# Patient Record
Sex: Female | Born: 1970 | Race: Black or African American | Hispanic: No | Smoking: Never smoker
Health system: Southern US, Community
[De-identification: ages and names within clinical notes are randomized; demographics above are authoritative.]

## PROBLEM LIST (undated history)

## (undated) DIAGNOSIS — D649 Anemia, unspecified: Secondary | ICD-10-CM

## (undated) DIAGNOSIS — I10 Essential (primary) hypertension: Secondary | ICD-10-CM

## (undated) DIAGNOSIS — R519 Headache, unspecified: Secondary | ICD-10-CM

## (undated) DIAGNOSIS — H6690 Otitis media, unspecified, unspecified ear: Secondary | ICD-10-CM

## (undated) DIAGNOSIS — Z8639 Personal history of other endocrine, nutritional and metabolic disease: Secondary | ICD-10-CM

## (undated) HISTORY — PX: ABDOMINAL HYSTERECTOMY: SHX81

---

## 2014-03-01 ENCOUNTER — Emergency Department (HOSPITAL_COMMUNITY)
Admission: EM | Admit: 2014-03-01 | Discharge: 2014-03-01 | Disposition: A | Discharge status: Home / Self Care | Payer: BC Managed Care – PPO | Attending: Emergency Medicine | Admitting: Emergency Medicine

## 2014-03-01 ENCOUNTER — Emergency Department (HOSPITAL_COMMUNITY): Payer: BC Managed Care – PPO

## 2014-03-01 ENCOUNTER — Encounter (HOSPITAL_COMMUNITY): Payer: Self-pay | Admitting: Emergency Medicine

## 2014-03-01 DIAGNOSIS — R0789 Other chest pain: Secondary | ICD-10-CM | POA: Diagnosis not present

## 2014-03-01 DIAGNOSIS — I10 Essential (primary) hypertension: Secondary | ICD-10-CM | POA: Diagnosis not present

## 2014-03-01 DIAGNOSIS — Z7982 Long term (current) use of aspirin: Secondary | ICD-10-CM | POA: Diagnosis not present

## 2014-03-01 DIAGNOSIS — M545 Low back pain, unspecified: Secondary | ICD-10-CM | POA: Diagnosis not present

## 2014-03-01 DIAGNOSIS — D649 Anemia, unspecified: Secondary | ICD-10-CM | POA: Diagnosis not present

## 2014-03-01 DIAGNOSIS — R5381 Other malaise: Secondary | ICD-10-CM | POA: Diagnosis not present

## 2014-03-01 DIAGNOSIS — Z79899 Other long term (current) drug therapy: Secondary | ICD-10-CM | POA: Insufficient documentation

## 2014-03-01 DIAGNOSIS — R5383 Other fatigue: Secondary | ICD-10-CM | POA: Diagnosis not present

## 2014-03-01 DIAGNOSIS — R0602 Shortness of breath: Secondary | ICD-10-CM | POA: Insufficient documentation

## 2014-03-01 DIAGNOSIS — R079 Chest pain, unspecified: Secondary | ICD-10-CM | POA: Diagnosis present

## 2014-03-01 HISTORY — DX: Essential (primary) hypertension: I10

## 2014-03-01 HISTORY — DX: Anemia, unspecified: D64.9

## 2014-03-01 LAB — CBC
HEMATOCRIT: 31.7 % — AB (ref 36.0–46.0)
HEMOGLOBIN: 10.7 g/dL — AB (ref 12.0–15.0)
MCH: 30.9 pg (ref 26.0–34.0)
MCHC: 33.8 g/dL (ref 30.0–36.0)
MCV: 91.6 fL (ref 78.0–100.0)
Platelets: 284 10*3/uL (ref 150–400)
RBC: 3.46 MIL/uL — ABNORMAL LOW (ref 3.87–5.11)
RDW: 14.4 % (ref 11.5–15.5)
WBC: 3.5 10*3/uL — AB (ref 4.0–10.5)

## 2014-03-01 LAB — BASIC METABOLIC PANEL
Anion gap: 11 (ref 5–15)
BUN: 10 mg/dL (ref 6–23)
CHLORIDE: 107 meq/L (ref 96–112)
CO2: 24 mEq/L (ref 19–32)
Calcium: 8.4 mg/dL (ref 8.4–10.5)
Creatinine, Ser: 0.89 mg/dL (ref 0.50–1.10)
GFR calc Af Amer: >90 mL/min (ref >90)
GFR calc non Af Amer: 78 mL/min — ABNORMAL LOW (ref >90)
GLUCOSE: 75 mg/dL (ref 70–99)
POTASSIUM: 3.5 meq/L — AB (ref 3.7–5.3)
Sodium: 142 mEq/L (ref 137–147)

## 2014-03-01 LAB — D-DIMER, QUANTITATIVE: D-Dimer, Quant: 5.84 ug/mL-FEU — ABNORMAL HIGH (ref 0.00–0.48)

## 2014-03-01 LAB — PRO B NATRIURETIC PEPTIDE: Pro B Natriuretic peptide (BNP): 94 pg/mL (ref 0–125)

## 2014-03-01 LAB — I-STAT TROPONIN, ED: TROPONIN I, POC: 0.02 ng/mL (ref 0.00–0.08)

## 2014-03-01 MED ORDER — PREDNISONE 20 MG PO TABS: ORAL_TABLET | ORAL | Status: DC

## 2014-03-01 MED ORDER — ALBUTEROL SULFATE HFA 108 (90 BASE) MCG/ACT IN AERS
2.0000 | INHALATION_SPRAY | RESPIRATORY_TRACT | Status: DC
Start: 2014-03-01 — End: 2014-03-01
  Administered 2014-03-01: 2 via RESPIRATORY_TRACT
  Filled 2014-03-01: qty 6.7

## 2014-03-01 MED ORDER — PREDNISONE 20 MG PO TABS
60.0000 mg | ORAL_TABLET | Freq: Once | ORAL | Status: AC
  Administered 2014-03-01: 60 mg via ORAL
  Filled 2014-03-01: qty 3

## 2014-03-01 MED ORDER — IOHEXOL 350 MG/ML SOLN
80.0000 mL | Freq: Once | INTRAVENOUS | Status: AC | PRN
  Administered 2014-03-01: 80 mL via INTRAVENOUS

## 2014-03-01 MED ORDER — KETOROLAC TROMETHAMINE 30 MG/ML IJ SOLN
30.0000 mg | Freq: Once | INTRAMUSCULAR | Status: AC
  Administered 2014-03-01: 30 mg via INTRAVENOUS
  Filled 2014-03-01: qty 1

## 2014-03-01 MED ORDER — SODIUM CHLORIDE 0.9 % IV BOLUS (SEPSIS)
1000.0000 mL | Freq: Once | INTRAVENOUS | Status: AC
  Administered 2014-03-01: 1000 mL via INTRAVENOUS

## 2014-03-01 MED ORDER — ALBUTEROL SULFATE (2.5 MG/3ML) 0.083% IN NEBU
5.0000 mg | INHALATION_SOLUTION | Freq: Once | RESPIRATORY_TRACT | Status: AC
  Administered 2014-03-01: 5 mg via RESPIRATORY_TRACT
  Filled 2014-03-01: qty 6

## 2014-03-01 NOTE — ED Provider Notes (Signed)
CSN: [id number]     Arrival date & time 03/01/14  1122 History   First MD Initiated Contact with Patient 03/01/14 1247     Chief Complaint  Patient presents with  . Chest Pain  . Shortness of Breath     (Consider location/radiation/quality/duration/timing/severity/associated sxs/prior Treatment) HPI Comments: Melissa Yang is a 43 y.o. Female with a PMHx of HTN and anemia, who presents to the ED today with ongoing chest tightness and SOB/DOE x2 weeks. States she was seen at [facility] 2 weeks ago, admitted at [facility] for "elevated enzymes", had an echo and heart cath which were negative but they told her she had "heart muscle inflammation". States her pain is continuing despite using the ibuprofen and prilosec they gave her at discharge 1wk ago. Pt states her CP is 6/10, pressure like, constant, located in her midsternal area, nonradiating, worse with inspiration and palpation, and alleviated some with aleve. States she's been SOB since discharge, mostly with exertion, stating she walks up 6 steps and gets winded. Endorses a nonproductive cough at night when lying flat, and uses 3 pillows to sleep, but this has not increased in the recent weeks. Also endorses ongoing low back pain worse with lifting or bending, but denies that this is coming from her chest. Denies fevers, chills, diaphoresis, jaw pain, upper back pain, abd pain, n/v/d/c, hematuria, dysuria, melena, hematochezia, HA, syncope, dizziness, LE swelling, recent travel, estrogen use (s/p hysterectomy due to heavy periods and anemia). States she takes ferritin nightly. Denies hx of DVT/PE. States she doesn't think they did any labs or imaging for PE/DVT at her last admission one week ago.  Patient is a 43 y.o. female presenting with shortness of breath. The history is provided by the patient. No language interpreter was used.  Shortness of Breath Severity:  Mild Onset quality:  Gradual Duration:  2 weeks Timing:   Constant Progression:  Unchanged Chronicity:  Chronic Context: activity   Relieved by:  Rest Worsened by:  Activity and deep breathing Ineffective treatments:  None tried Associated symptoms: chest pain and cough (when laying down, nonproductive)   Associated symptoms: no abdominal pain, no claudication, no diaphoresis, no fever, no headaches, no hemoptysis, no neck pain, no PND, no rash, no sputum production, no syncope, no vomiting and no wheezing   Chest pain:    Quality:  Pressure   Severity:  Moderate   Onset quality:  Gradual   Duration:  2 weeks   Timing:  Constant   Progression:  Unchanged   Chronicity:  Chronic Risk factors: recent surgery (recent heart cath)   Risk factors: no family hx of DVT, no hx of cancer, no hx of PE/DVT and no oral contraceptive use     Past Medical History  Diagnosis Date  . Hypertension   . Anemia    Past Surgical History  Procedure Laterality Date  . Abdominal hysterectomy     History reviewed. No pertinent family history. History  Substance Use Topics  . Smoking status: Not on file  . Smokeless tobacco: Not on file  . Alcohol Use: No   OB History   Grav Para Term Preterm Abortions TAB SAB Ect Mult Living                 Review of Systems  Constitutional: Positive for fatigue. Negative for fever, chills and diaphoresis.  HENT: Negative for nosebleeds.   Respiratory: Positive for cough (when laying down, nonproductive) and shortness of breath. Negative for hemoptysis, sputum production  and wheezing.   Cardiovascular: Positive for chest pain. Negative for palpitations, claudication, leg swelling, syncope and PND.  Gastrointestinal: Negative for nausea, vomiting, abdominal pain, diarrhea, constipation, blood in stool and abdominal distention.  Genitourinary: Negative for dysuria, urgency, frequency, hematuria, decreased urine volume, vaginal bleeding, vaginal discharge and pelvic pain.  Musculoskeletal: Positive for back pain (low  back). Negative for arthralgias, joint swelling, myalgias and neck pain.  Skin: Negative for rash.  Neurological: Negative for dizziness, syncope, weakness, numbness and headaches.  Hematological: Does not bruise/bleed easily.  Psychiatric/Behavioral: Negative for confusion.  10 Systems reviewed and are negative for acute change except as noted in the HPI.     Allergies  Review of patient's allergies indicates no known allergies.  Home Medications   Prior to Admission medications   Medication Sig Start Date End Date Taking? Authorizing Provider  ALPRAZolam Prudy Feeler) 1 MG tablet Take 1 mg by mouth daily as needed for anxiety.   Yes Historical Provider, MD  amLODipine-benazepril (LOTREL) 5-40 MG per capsule Take 1 capsule by mouth 2 (two) times daily.   Yes Historical Provider, MD  aspirin 81 MG tablet Take 81 mg by mouth daily.   Yes Historical Provider, MD  ibuprofen (ADVIL,MOTRIN) 600 MG tablet Take 600 mg by mouth every 6 (six) hours as needed for moderate pain.   Yes Historical Provider, MD  predniSONE (DELTASONE) 20 MG tablet 3 tabs po daily x 4 days 03/01/14   [place] Camprubi-Soms, PA-C   BP 111/86  Pulse 93  Temp(Src) 97.8 F (36.6 C) (Oral)  Resp 21  Ht  (1.676 m)  Wt 172 lb 9 oz (78.274 kg)  BMI 27.87 kg/m2  SpO2 100% Physical Exam  Nursing note and vitals reviewed. Constitutional: She is oriented to person, place, and time. Vital signs are normal. She appears well-developed and well-nourished. No distress.  VSS, NAD  HENT:  Head: Normocephalic and atraumatic.  Mouth/Throat: Mucous membranes are dry.  Mildly dry mucous membranes  Eyes: Conjunctivae and EOM are normal. Right eye exhibits no discharge. Left eye exhibits no discharge.  Neck: Normal range of motion. Neck supple. No JVD present.  Cardiovascular: Normal rate, regular rhythm, normal heart sounds and intact distal pulses.  Exam reveals no gallop and no friction rub.   No murmur heard. RRR, nl  s1/s2, no m/r/g, distal pulses intact  Pulmonary/Chest: Effort normal and breath sounds normal. No respiratory distress. She has no decreased breath sounds. She has no wheezes. She has no rhonchi. She has no rales. She exhibits tenderness. She exhibits no crepitus, no edema and no deformity.    CTAB in all lung fields, no increased WOB, oxygenating >95% on RA, no w/r/r. Reproducible CP with TTP over sternum, no crepitus or deformity, no subQ air  Abdominal: Soft. Normal appearance and bowel sounds are normal. She exhibits no distension. There is no tenderness. There is no rigidity, no rebound and no guarding.  Musculoskeletal: Normal range of motion.  MAE x4, strength and sensation grossly intact Neg homan's sign bilaterally No pedal edema  Neurological: She is alert and oriented to person, place, and time. She has normal strength. No sensory deficit.  Skin: Skin is warm, dry and intact. No rash noted.  Psychiatric: She has a normal mood and affect.    ED Course  Procedures (including critical care time) Orthostatic VS 1405: Orthostatic Lying - BP- Lying: 118/80 mmHg ; Pulse- Lying: 90  Orthostatic Sitting - BP- Sitting: 124/93 mmHg ; Pulse- Sitting: 90  Orthostatic Standing at 0 minutes - BP- Standing at 0 minutes: 112/81 mmHg ; Pulse- Standing at 0 minutes: 103   Labs Review Labs Reviewed  CBC - Abnormal; Notable for the following:    WBC 3.5 (*)    RBC 3.46 (*)    Hemoglobin 10.7 (*)    HCT 31.7 (*)    All other components within normal limits  BASIC METABOLIC PANEL - Abnormal; Notable for the following:    Potassium 3.5 (*)    GFR calc non Af Amer 78 (*)    All other components within normal limits  D-DIMER, QUANTITATIVE - Abnormal; Notable for the following:    D-Dimer, Quant 5.84 (*)    All other components within normal limits  PRO B NATRIURETIC PEPTIDE  I-STAT TROPOININ, ED    Imaging Review Dg Chest 2 View  03/01/2014   CLINICAL DATA:  Chest pain and shortness of  breath.  EXAM: CHEST - 2 VIEW  COMPARISON:  None  FINDINGS: The heart size and mediastinal contours are within normal limits. There is no evidence of pulmonary edema, consolidation, pneumothorax, nodule or pleural fluid. The visualized skeletal structures are unremarkable.  IMPRESSION: No active disease.   Electronically Signed   By: Irish Lack M.D.   On: 03/01/2014 12:44   Ct Angio Chest Pe W/cm &/or Wo Cm  03/01/2014   CLINICAL DATA:  Inspiratory chest pain with elevated D-dimer. Chest pain a shortness-of-breath 2 weeks. Nonproductive cough. Negative heart catheterization.  EXAM: CT ANGIOGRAPHY CHEST WITH CONTRAST  TECHNIQUE: Multidetector CT imaging of the chest was performed using the standard protocol during bolus administration of intravenous contrast. Multiplanar CT image reconstructions and MIPs were obtained to evaluate the vascular anatomy.  CONTRAST:  80mL OMNIPAQUE IOHEXOL 350 MG/ML SOLN  COMPARISON:  Chest x-ray today.  FINDINGS: The lungs are adequately inflated without consolidation or effusion. Airways are within normal. The heart is normal size. Pulmonary arterial system is within normal without evidence of emboli. The thoracic aorta is within normal. Remaining mediastinal structures are normal.  Images through the upper abdomen are within normal. Remainder the exam is unremarkable.  Review of the MIP images confirms the above findings.  IMPRESSION: No acute cardiopulmonary disease. No evidence of pulmonary embolism.   Electronically Signed   By: Elberta Fortis M.D.   On: 03/01/2014 16:13   From outside hospital: CTA angio 02/19/2014 FINDINGS:  NO evidence of PE, aneurysm/dissection, or other vascular abnormality.  NO acute abnormality in the mediastinum or axillae (no mass, hematoma, or adenopathy).  The 4.7 mm groundglass nodule of the lingula seen on CT dated February 06, 2012 is without change and can therefore be considered benign, requiring no further followup. NO lung consolidation,  pleural effusion, pneumothorax, or other acute abnormality.  NO acute osseous lesion. IMPRESSION: No acute findings  Diagnostics: echo 2d 02/20/2014 portable limited echocardiogram was performed. A two-dimensional transthoracic echocardiogram, with color flow Doppler was performed. Limited views were obtained. There is no comparison study available. The left ventricle is normal in size, wall thickness and wall motion with ejection fraction of 55-60%. The aortic valve is not well visualized, but is grossly normal.  Cardiac cath 02/21/2014 Impression: Near-normal coronary arteries Left ventricular end-diastolic pressure 15 mm Hg      EKG Interpretation None    EKG 03/01/14: sinus tachy HR 101, otherwise normal  MDM   Final diagnoses:  Atypical chest pain  SOB (shortness of breath)  Anemia, unspecified anemia type    43y/o  female with ongoing CP/SOB x2 wks, seen previously at [facility] and transferred to [facility]. Had echo and heart cath without any abnormal results. At d/c they told her to use ibuprofen and nexium for ?musculoskeletal pain (or heart muscle irritation? Pt unclear, initially unable to obtain OSH documents). Pt denies that they did anything to r/o clots. Labs obtained in triage reveal neg trop, CBC showing H/H at 10.7/31.7. Pt is on iron for chronic anemia, will attempt to get OSH results for comparison, but this could be source of DOE/SOB. Orthostatics positive (DBP with drop from sitting to standing), therefore will start fluids. BMP showing K 3.5, does not need repletion. BNP 94. CXR WNL. EKG showing sinus tachy at 101, but pt not tachycardic upon exam. Oxygenating well on RA. Given inspiratory CP/SOB, will obtain dimer. Will give toradol for pain, given that it's reproducible and could be musculoskeletal. Will reassess after this.   2:50 PM Ddimer elevated to 5.84. Will order CT chest for PE.   4:34 PM CTA chest neg for PE or other intrathoracic  etiology. BP improved after 1L.  Will attempt to ambulate pt in halls and monitor oxygenation, if pt unable to do this, will admit for further work up. OSH records have now been obtained, and it appears her anemia is stable from prior results, and that dimer on 8/28 was also elevated, and CTA also revealed no PE/dissection. It appears they diagnosed her with "myopericarditis".   5:30 PM Pt ambulated with Dr. Patria Mane, able to maintain oxygen saturations. Pt states she has used albuterol in the past for SOB, although this wasn't in her meds list and no mention of asthma on hx, as well as clear lung exam. Dr. Patria Mane ordered  prednisone and albuterol inhaler for pt to go home with. Plan to d/c with prednisone for 4 days. Discussed ongoing use of ibuprofen as directed by her discharge instructions previously. Pt to f/up with pulmonology this week for PFTs and ongoing management.   BP 135/93  Pulse 107  Temp(Src) 97.8 F (36.6 C) (Oral)  Resp 18  Ht  (1.676 m)  Wt 172 lb 9 oz (78.274 kg)  BMI 27.87 kg/m2  SpO2 100%  Meds ordered this encounter  Medications  . sodium chloride 0.9 % bolus 1,000 mL    Sig:   . ketorolac (TORADOL) 30 MG/ML injection 30 mg    Sig:   . iohexol (OMNIPAQUE) 350 MG/ML injection 80 mL    Sig:   . albuterol (PROVENTIL HFA;VENTOLIN HFA) 108 (90 BASE) MCG/ACT inhaler 2 puff    Sig:   . predniSONE (DELTASONE) tablet 60 mg    Sig:   . albuterol (PROVENTIL) (2.5 MG/3ML) 0.083% nebulizer solution 5 mg    Sig:   . predniSONE (DELTASONE) 20 MG tablet    Sig: 3 tabs po daily x 4 days    Dispense:  12 tablet    Refill:  0    Order Specific Question:  Supervising Provider    Answer:  Vida Roller [address] Camprubi-Soms, PA-C 03/01/14 1942

## 2014-03-01 NOTE — ED Provider Notes (Signed)
Medical screening examination/treatment/procedure(s) were conducted as a shared visit with non-physician practitioner(s) and myself.  I personally evaluated the patient during the encounter.   EKG Interpretation   Date/Time:  Sunday March 01 2014 11:58:55 EDT Ventricular Rate:  101 PR Interval:  156 QRS Duration: 84 QT Interval:  340 QTC Calculation: 440 R Axis:   43 Text Interpretation:  Sinus tachycardia Otherwise normal ECG No  significant change was found Confirmed by Zyiere Rosemond  MD, Zyron Deeley ([id number]) on  03/01/2014 9:50:26 PM      CTA normal. Recent heart cath at [facility] normal. i personally ambulated with the pt in the ER. Returned to her room with normal 02 saturations. No increased work of breathing. Will trial on steroids and albuterol with outpatient pulmonary follow up. Echo during recent [facility] hospitalization with EF of 65%. Pt understands to return to ER for new or worsening symptoms  Dg Chest 2 View  03/01/2014   CLINICAL DATA:  Chest pain and shortness of breath.  EXAM: CHEST - 2 VIEW  COMPARISON:  None  FINDINGS: The heart size and mediastinal contours are within normal limits. There is no evidence of pulmonary edema, consolidation, pneumothorax, nodule or pleural fluid. The visualized skeletal structures are unremarkable.  IMPRESSION: No active disease.   Electronically Signed   By: Irish Lack M.D.   On: 03/01/2014 12:44   Ct Angio Chest Pe W/cm &/or Wo Cm  03/01/2014   CLINICAL DATA:  Inspiratory chest pain with elevated D-dimer. Chest pain a shortness-of-breath 2 weeks. Nonproductive cough. Negative heart catheterization.  EXAM: CT ANGIOGRAPHY CHEST WITH CONTRAST  TECHNIQUE: Multidetector CT imaging of the chest was performed using the standard protocol during bolus administration of intravenous contrast. Multiplanar CT image reconstructions and MIPs were obtained to evaluate the vascular anatomy.  CONTRAST:  80mL OMNIPAQUE IOHEXOL 350 MG/ML SOLN  COMPARISON:  Chest x-ray  today.  FINDINGS: The lungs are adequately inflated without consolidation or effusion. Airways are within normal. The heart is normal size. Pulmonary arterial system is within normal without evidence of emboli. The thoracic aorta is within normal. Remaining mediastinal structures are normal.  Images through the upper abdomen are within normal. Remainder the exam is unremarkable.  Review of the MIP images confirms the above findings.  IMPRESSION: No acute cardiopulmonary disease. No evidence of pulmonary embolism.   Electronically Signed   By: Elberta Fortis M.D.   On: 03/01/2014 16:13  I personally reviewed the imaging tests through PACS system I reviewed available ER/hospitalization records through the EMR   Lyanne Co, MD 03/01/14 2154

## 2014-03-01 NOTE — Discharge Instructions (Signed)
Your chest pain was evaluated here today and does not look like it's related to your heart or lungs. It could be muscle pain, continue using ibuprofen as directed by your discharge instructions after your last hospitalization. Use the inhaler given to you today as directed and as needed for shortness of breath. Use prednisone as directed to help with your breathing and inflammation. See [facility] pulmonology this week for pulmonary function studies to evaluate your ongoing shortness of breath. Continue taking your ferritin as directed, with orange juice. Return to the ER for changes or worsening symptoms.   Chest Pain (Nonspecific) It is often hard to give a diagnosis for the cause of chest pain. There is always a chance that your pain could be related to something serious, such as a heart attack or a blood clot in the lungs. You need to follow up with your doctor. HOME CARE  If antibiotic medicine was given, take it as directed by your doctor. Finish the medicine even if you start to feel better.  For the next few days, avoid activities that bring on chest pain. Continue physical activities as told by your doctor.  Do not use any tobacco products. This includes cigarettes, chewing tobacco, and e-cigarettes.  Avoid drinking alcohol.  Only take medicine as told by your doctor.  Follow your doctor's suggestions for more testing if your chest pain does not go away.  Keep all doctor visits you made. GET HELP IF:  Your chest pain does not go away, even after treatment.  You have a rash with blisters on your chest.  You have a fever. GET HELP RIGHT AWAY IF:   You have more pain or pain that spreads to your arm, neck, jaw, back, or belly (abdomen).  You have shortness of breath.  You cough more than usual or cough up blood.  You have very bad back or belly pain.  You feel sick to your stomach (nauseous) or throw up (vomit).  You have very bad weakness.  You pass out (faint).  You  have chills. This is an emergency. Do not wait to see if the problems will go away. Call your local emergency services (911 in [place] Do not drive yourself to the hospital. MAKE SURE YOU:   Understand these instructions.  Will watch your condition.  Will get help right away if you are not doing well or get worse. Document Released: 11/29/2007 Document Revised: 06/17/2013 Document Reviewed: 11/29/2007 [facility] Patient Information 2015 [place], [place]. This information is not intended to replace advice given to you by your health care provider. Make sure you discuss any questions you have with your health care provider.  Anemia, Nonspecific Anemia is a condition in which the concentration of red blood cells or hemoglobin in the blood is below normal. Hemoglobin is a substance in red blood cells that carries oxygen to the tissues of the body. Anemia results in not enough oxygen reaching these tissues.  CAUSES  Common causes of anemia include:   Excessive bleeding. Bleeding may be internal or external. This includes excessive bleeding from periods (in women) or from the intestine.   Poor nutrition.   Chronic kidney, thyroid, and liver disease.  Bone marrow disorders that decrease red blood cell production.  Cancer and treatments for cancer.  HIV, AIDS, and their treatments.  Spleen problems that increase red blood cell destruction.  Blood disorders.  Excess destruction of red blood cells due to infection, medicines, and autoimmune disorders. SIGNS AND SYMPTOMS   Minor  weakness.   Dizziness.   Headache.  Palpitations.   Shortness of breath, especially with exercise.   Paleness.  Cold sensitivity.  Indigestion.  Nausea.  Difficulty sleeping.  Difficulty concentrating. Symptoms may occur suddenly or they may develop slowly.  DIAGNOSIS  Additional blood tests are often needed. These help your health care provider determine the best treatment. Your health care  provider will check your stool for blood and look for other causes of blood loss.  TREATMENT  Treatment varies depending on the cause of the anemia. Treatment can include:   Supplements of iron, vitamin B12, or folic acid.   Hormone medicines.   A blood transfusion. This may be needed if blood loss is severe.   Hospitalization. This may be needed if there is significant continual blood loss.   Dietary changes.  Spleen removal. HOME CARE INSTRUCTIONS Keep all follow-up appointments. It often takes many weeks to correct anemia, and having your health care provider check on your condition and your response to treatment is very important. SEEK IMMEDIATE MEDICAL CARE IF:   You develop extreme weakness, shortness of breath, or chest pain.   You become dizzy or have trouble concentrating.  You develop heavy vaginal bleeding.   You develop a rash.   You have bloody or black, tarry stools.   You faint.   You vomit up blood.   You vomit repeatedly.   You have abdominal pain.  You have a fever or persistent symptoms for more than 2-3 days.   You have a fever and your symptoms suddenly get worse.   You are dehydrated.  MAKE SURE YOU:  Understand these instructions.  Will watch your condition.  Will get help right away if you are not doing well or get worse. Document Released: 07/20/2004 Document Revised: 02/12/2013 Document Reviewed: 12/06/2012 [facility] Patient Information 2015 [place], [place]. This information is not intended to replace advice given to you by your health care provider. Make sure you discuss any questions you have with your health care provider.  Shortness of Breath Shortness of breath means you have trouble breathing. Shortness of breath needs medical care right away. HOME CARE   Do not smoke.  Avoid being around chemicals or things (paint fumes, dust) that may bother your breathing.  Rest as needed. Slowly begin your normal  activities.  Only take medicines as told by your doctor.  Keep all doctor visits as told. GET HELP RIGHT AWAY IF:   Your shortness of breath gets worse.  You feel lightheaded, pass out (faint), or have a cough that is not helped by medicine.  You cough up blood.  You have pain with breathing.  You have pain in your chest, arms, shoulders, or belly (abdomen).  You have a fever.  You cannot walk up stairs or exercise the way you normally do.  You do not get better in the time expected.  You have a hard time doing normal activities even with rest.  You have problems with your medicines.  You have any new symptoms. MAKE SURE YOU:  Understand these instructions.  Will watch your condition.  Will get help right away if you are not doing well or get worse. Document Released: 11/29/2007 Document Revised: 06/17/2013 Document Reviewed: 08/28/2011 [facility] Patient Information 2015 [place], [place]. This information is not intended to replace advice given to you by your health care provider. Make sure you discuss any questions you have with your health care provider.

## 2014-03-01 NOTE — ED Notes (Addendum)
Pt reports having chest pressure and sob x 2 weeks. Has non productive cough, mid back pain and sob with exertion. Has been to [facility] and reports having +enzymes and negative heart cath. Did not have ct scan to r/o PE. Airway intact at triage and EKG done.

## 2014-03-05 ENCOUNTER — Encounter: Payer: Self-pay | Admitting: Internal Medicine

## 2014-03-05 ENCOUNTER — Ambulatory Visit (INDEPENDENT_AMBULATORY_CARE_PROVIDER_SITE_OTHER): Payer: BC Managed Care – PPO | Admitting: Internal Medicine

## 2014-03-05 VITALS — BP 120/72 | HR 104 | Temp 98.4°F | Ht 66.0 in | Wt 176.6 lb

## 2014-03-05 DIAGNOSIS — I10 Essential (primary) hypertension: Secondary | ICD-10-CM | POA: Insufficient documentation

## 2014-03-05 DIAGNOSIS — R079 Chest pain, unspecified: Secondary | ICD-10-CM

## 2014-03-05 MED ORDER — AMLODIPINE-OLMESARTAN 5-20 MG PO TABS: ORAL_TABLET | ORAL | Status: DC

## 2014-03-05 MED ORDER — TRAMADOL HCL 50 MG PO TABS
ORAL_TABLET | ORAL | Status: DC
Start: 2014-03-05 — End: 2018-05-17

## 2014-03-05 NOTE — Progress Notes (Signed)
Subjective:    Patient ID: Melissa Yang, female    DOB: 03/23/1971  MRN: [id number]  HPI  61 yobf never smoker never sign problem other than HBP on ACEi then winter 2015 sinus drainage, severe cough >  [facility] dx as sinus infection/ "bronchitis"  and rx abx and inhaler which seems to work s needing maint rx  then onset of chest pressure x late August 2015 develped cough in setting of ? Recurrent sinus infection then midline chest discomt >  took antacids no better then evolved to chest pain/back pain and sob > [place] > forsynth > neg echo/ [facility] rx as pericarditis with nsaids and prednisone no better then > [facility] ER  and neg CTa (second one) and referred  To  pulmonary  03/05/2014    03/05/2014 1st [facility] office visit/ Melissa Yang / still on ACEi  Chief Complaint  Patient presents with  . Pulmonary Consult    Referred by Dr. Azalia Bilis. Pt c/o DOE and chest pressure x 2 wks. She gets SOB walking approx 20 ft. Her symptoms seem worse when she bends over. She sleeps propped up on 2 pillows b/c when lies flat she has a dry cough.   the only thing that's really helped her feel ebeber is lying down with pillows under her chest. Cough is dry and really not that prominent a symptom though coughed on insp during exam.  No pain on swallowing   No obvious patterns  day to day or daytime variabilty or assoc    subjective wheeze overt sinus or hb symptoms. No unusual exp hx or h/o childhood pna/ asthma or knowledge of premature birth.  Sleeping ok without nocturnal  or early am exacerbation  of respiratory  c/o's or need for noct saba. Also denies any obvious fluctuation of symptoms with weather or environmental changes or other aggravating or alleviating factors except as outlined above   Current Medications, Allergies, Complete Past Medical History, Past Surgical History, Family History, and Social History were reviewed in [employer] record.  ROS  The following are not  active complaints unless bolded sore throat, dysphagia, dental problems, itching, sneezing,  nasal congestion or excess/ purulent secretions, ear ache,   fever, chills, sweats, unintended wt loss, pleuritic or exertional cp, hemoptysis,  orthopnea pnd or leg swelling, presyncope, palpitations, heartburn, abdominal pain, anorexia, nausea, vomiting, diarrhea  or change in bowel or urinary habits, change in stools or urine, dysuria,hematuria,  rash, arthralgias, visual complaints, headache, numbness weakness or ataxia or problems with walking or coordination,  change in mood/affect or memory.        Review of Systems  Constitutional: Negative for fever, chills and unexpected weight change.  HENT: Negative for congestion, dental problem, ear pain, nosebleeds, postnasal drip, rhinorrhea, sinus pressure, sneezing, sore throat, trouble swallowing and voice change.   Eyes: Negative for visual disturbance.  Respiratory: Positive for shortness of breath. Negative for cough and choking.   Cardiovascular: Positive for chest pain. Negative for leg swelling.  Gastrointestinal: Negative for vomiting, abdominal pain and diarrhea.  Genitourinary: Negative for difficulty urinating.  Musculoskeletal: Negative for arthralgias.  Skin: Negative for rash.  Neurological: Negative for tremors, syncope and headaches.  Hematological: Does not bruise/bleed easily.       Objective:   Physical Exam   amb mod anxious wf nad Wt Readings from Last 3 Encounters:  03/05/14 176 lb 9.6 oz (80.105 kg)  03/01/14 172 lb 9 oz (78.274 kg)    HEENT:  nl dentition, turbinates, and orophanx. Nl external ear canals without cough reflex   NECK :  without JVD/Nodes/TM/ nl carotid upstrokes bilaterally   LUNGS: no acc muscle use, clear to A and P bilaterally with  cough on insp  w maneuvers   CV:  RRR  no s3 or murmur or increase in P2, no edema   ABD:  soft and nontender with nl excursion in the supine position. No bruits or  organomegaly, bowel sounds nl  MS:  warm without deformities, calf tenderness, cyanosis or clubbing  SKIN: warm and dry without lesions    NEURO:  alert, approp, no deficits   Echo 02/20/14 no effusion .  CTa 03/01/14 wnl (second one)      Assessment & Plan:

## 2014-03-05 NOTE — Patient Instructions (Addendum)
Azor 5/20 one twice daily   Nexium 40 mg Take 30-60 min before first meal of the day pepcid ac 20 mg at bedtime until return (over the counter)  Treatment consists of avoiding foods that cause gas (especially beans and raw vegetables like spinach and salads and boiled eggs)    GERD (REFLUX)  is an extremely common cause of respiratory symptoms, many times with no significant heartburn at all.    It can be treated with medication, but also with lifestyle changes including avoidance of late meals, excessive alcohol, smoking cessation, and avoid fatty foods, chocolate, peppermint, colas, red wine, and acidic juices such as orange juice.  NO MINT OR MENTHOL PRODUCTS SO NO COUGH DROPS  USE SUGARLESS CANDY INSTEAD (jolley ranchers or Stover's)  NO OIL BASED VITAMINS - use powdered substitutes.  For pain > try tramadol 50 mg one every 4 hours as needed   Please schedule a follow up office visit in 2 weeks, sooner if needed

## 2014-03-05 NOTE — Assessment & Plan Note (Signed)
Symptoms are markedly disproportionate to objective findings and not clear this is a lung problem but pt does appear to have difficult airway management issues. DDX of  difficult airways management all start with A and  include Adherence, Ace Inhibitors, Acid Reflux, Active Sinus Disease, Alpha 1 Antitripsin deficiency, Anxiety masquerading as Airways dz,  ABPA,  allergy(esp in young), Aspiration (esp in elderly), Adverse effects of DPI,  Active smokers, plus two Bs  = Bronchiectasis and Beta blocker use..and one C= CHF  ACEi top of the list despite the fact that cough is not the most prominent feature. Needs trial off (see hbp)  ? Acid (or non-acid) GERD > always difficult to exclude as up to 75% of pts in some series report no assoc GI/ Heartburn symptoms> rec max (24h)  acid suppression and diet restrictions/ reviewed and instructions given in writing.   rx pain with tramadol, f/u 2 weeks

## 2014-03-05 NOTE — Assessment & Plan Note (Addendum)
ACE inhibitors are problematic in  pts with airway complaints because  even experienced pulmonologists can't always distinguish ace effects from copd/asthma/pnds/ allergies etc.  By themselves they don't actually cause a problem, much like oxygen can't by itself start a fire, but they certainly serve as a powerful catalyst or enhancer for any "fire"  or inflammatory process in the upper airway, be it caused by an ET  tube or more commonly reflux (especially in the obese or pts with known GERD or who are on biphoshonates) or URI's, due to interference with bradykinin clearance.  The effects of acei on bradykinin levels occurs in 100% of pt's on acei (unless they surreptitiously stop the med!) but the classic cough is only reported in 5%.  This leaves 95% of pts on acei's  with a variety of syndromes including no identifiable symptom in most  vs non-specific symptoms that wax and wane depending on what other insult is occuring at the level of the upper airway (GERD the most likely but note the onset with apparent uri)   For now try azor 20/5 bid

## 2014-03-20 ENCOUNTER — Ambulatory Visit: Payer: BC Managed Care – PPO | Admitting: Internal Medicine

## 2015-08-22 ENCOUNTER — Emergency Department (HOSPITAL_COMMUNITY)
Admission: EM | Admit: 2015-08-22 | Discharge: 2015-08-22 | Disposition: A | Discharge status: Home / Self Care | Payer: BLUE CROSS/BLUE SHIELD | Attending: Emergency Medicine | Admitting: Emergency Medicine

## 2015-08-22 ENCOUNTER — Encounter (HOSPITAL_COMMUNITY): Payer: Self-pay | Admitting: Emergency Medicine

## 2015-08-22 DIAGNOSIS — I10 Essential (primary) hypertension: Secondary | ICD-10-CM | POA: Diagnosis not present

## 2015-08-22 DIAGNOSIS — Z7982 Long term (current) use of aspirin: Secondary | ICD-10-CM | POA: Insufficient documentation

## 2015-08-22 DIAGNOSIS — Z79899 Other long term (current) drug therapy: Secondary | ICD-10-CM | POA: Diagnosis not present

## 2015-08-22 DIAGNOSIS — Z862 Personal history of diseases of the blood and blood-forming organs and certain disorders involving the immune mechanism: Secondary | ICD-10-CM | POA: Insufficient documentation

## 2015-08-22 DIAGNOSIS — H6592 Unspecified nonsuppurative otitis media, left ear: Secondary | ICD-10-CM | POA: Insufficient documentation

## 2015-08-22 DIAGNOSIS — H9202 Otalgia, left ear: Secondary | ICD-10-CM | POA: Diagnosis present

## 2015-08-22 DIAGNOSIS — J029 Acute pharyngitis, unspecified: Secondary | ICD-10-CM | POA: Diagnosis not present

## 2015-08-22 DIAGNOSIS — H6692 Otitis media, unspecified, left ear: Secondary | ICD-10-CM

## 2015-08-22 MED ORDER — AMOXICILLIN 500 MG PO CAPS: 500.0000 mg | ORAL_CAPSULE | Freq: Two times a day (BID) | ORAL | Status: DC

## 2015-08-22 NOTE — Discharge Instructions (Signed)
Otitis Media, Adult Otitis media is redness, soreness, and puffiness (swelling) in the space just behind your eardrum (middle ear). It may be caused by allergies or infection. It often happens along with a cold. HOME CARE  Take your medicine as told. Finish it even if you start to feel better.  Only take over-the-counter or prescription medicines for pain, discomfort, or fever as told by your doctor.  Follow up with your doctor as told. GET HELP IF:  You have otitis media only in one ear, or bleeding from your nose, or both.  You notice a lump on your neck.  You are not getting better in 3-5 days.  You feel worse instead of better. GET HELP RIGHT AWAY IF:   You have pain that is not helped with medicine.  You have puffiness, redness, or pain around your ear.  You get a stiff neck.  You cannot move part of your face (paralysis).  You notice that the bone behind your ear hurts when you touch it. MAKE SURE YOU:   Understand these instructions.  Will watch your condition.  Will get help right away if you are not doing well or get worse.   This information is not intended to replace advice given to you by your health care provider. Make sure you discuss any questions you have with your health care provider.  Follow-up with your primary care provider if your symptoms do not improve. Take antibiotic as prescribed. Return to the emergency department if you experience severe worsening of your symptoms, neck pain or stiffness, increased fever, pain in the bone behind her ear, facial swelling.

## 2015-08-22 NOTE — ED Notes (Addendum)
Pt reports 2 day hx of couigh and sore throat. Reports one episode of vomiting after taking cough meds. Also c/o fever this am of 100.3 axillary, and l/ear pain. Pt also c/o low back pain with deep breath

## 2015-08-23 NOTE — ED Provider Notes (Signed)
CSN: [id number]     Arrival date & time 08/22/15  1114 History   First MD Initiated Contact with Patient 08/22/15 1127     Chief Complaint  Patient presents with  . Otalgia    l/ear pain  . Sore Throat    2 day hx of sore throat  . Cough    2 day hx of cough     (Consider location/radiation/quality/duration/timing/severity/associated sxs/prior Treatment) HPI  Melissa Yang is a 45 y.o F with a pmhx of HTN who presents to the ED c/o cough, otalgia, ST. Pt states that over the last 2 days she has had a nonproductive cough and fever. Yesterday pt developed left otalgia and sore throat. Pt had 1 episode of posttussive emesis yesterday. Pt has tylenol with mild relief of her symptoms. Denies dizziness, difficulty breathing, rhinorrhea, headache, diarrhea, dysuria, dysphagia.   Past Medical History  Diagnosis Date  . Hypertension   . Anemia    Past Surgical History  Procedure Laterality Date  . Abdominal hysterectomy     Family History  Problem Relation Age of Onset  . Emphysema Mother     smoked  . Heart disease Mother   . Hypertension Mother   . Diabetes Mother   . Heart failure Mother   . Heart disease Father   . Diabetes Father   . Hypertension Father   . Breast cancer Maternal Grandmother   . Pancreatic cancer Paternal Grandmother    Social History  Substance Use Topics  . Smoking status: Never Smoker   . Smokeless tobacco: Never Used  . Alcohol Use: No   OB History    No data available     Review of Systems  All other systems reviewed and are negative.     Allergies  Review of patient's allergies indicates no known allergies.  Home Medications   Prior to Admission medications   Medication Sig Start Date End Date Taking? Authorizing Provider  albuterol (VENTOLIN HFA) 108 (90 BASE) MCG/ACT inhaler Inhale 2 puffs into the lungs every 6 (six) hours as needed for wheezing or shortness of breath.    Historical Provider, MD  ALPRAZolam Prudy Feeler) 1 MG tablet  Take 1 mg by mouth daily as needed for anxiety.    Historical Provider, MD  amLODipine-olmesartan (AZOR) 5-20 MG per tablet One twice daily 03/05/14   Nyoka Cowden, MD  amoxicillin (AMOXIL) 500 MG capsule Take 1 capsule (500 mg total) by mouth 2 (two) times daily. 08/22/15   Samantha Tripp Dowless, PA-C  aspirin 81 MG tablet Take 81 mg by mouth daily.    Historical Provider, MD  traMADol (ULTRAM) 50 MG tablet 1-2 every 4 hours as needed for cough or pain 03/05/14   Nyoka Cowden, MD   BP 139/85 mmHg  Pulse 81  Temp(Src) 98.3 F (36.8 C) (Oral)  Resp 20  SpO2 100% Physical Exam  Constitutional: She is oriented to person, place, and time. She appears well-developed and well-nourished. No distress.  HENT:  Head: Normocephalic and atraumatic.  Right Ear: Tympanic membrane normal.  Left Ear: No mastoid tenderness. Tympanic membrane is erythematous. A middle ear effusion is present.  Mouth/Throat: Oropharynx is clear and moist. No oropharyngeal exudate.  Eyes: Conjunctivae are normal. Right eye exhibits no discharge. Left eye exhibits no discharge. No scleral icterus.  Cardiovascular: Normal rate.   Pulmonary/Chest: Effort normal and breath sounds normal. No respiratory distress. She has no wheezes. She has no rales. She exhibits no tenderness.  Neurological: She  is alert and oriented to person, place, and time. Coordination normal.  Skin: Skin is warm and dry. No rash noted. She is not diaphoretic. No erythema. No pallor.  Psychiatric: She has a normal mood and affect. Her behavior is normal.  Nursing note and vitals reviewed.   ED Course  Procedures (including critical care time) Labs Review Labs Reviewed - No data to display  Imaging Review No results found. I have personally reviewed and evaluated these images and lab results as part of my medical decision-making.   EKG Interpretation None      MDM   Final diagnoses:  Acute left otitis media, recurrence not specified,  unspecified otitis media type    Patient presents with otalgia and exam consistent with acute otitis media. No concern for acute mastoiditis, meningitis. No antibiotic use in the last month.  Patient discharged home with Amoxicillin.  Follow up with PCP.  I have also discussed reasons to return immediately to the ER.  Pt expresses understanding and agrees with plan.       Lester Kinsman [place], PA-C 08/23/15 2027  Geoffery Lyons, MD 08/23/15 [phone]

## 2016-05-09 ENCOUNTER — Emergency Department (HOSPITAL_COMMUNITY)
Admission: EM | Admit: 2016-05-09 | Discharge: 2016-05-09 | Disposition: A | Discharge status: Home / Self Care | Payer: BLUE CROSS/BLUE SHIELD | Attending: Emergency Medicine | Admitting: Emergency Medicine

## 2016-05-09 ENCOUNTER — Encounter (HOSPITAL_COMMUNITY): Payer: Self-pay

## 2016-05-09 DIAGNOSIS — Z7982 Long term (current) use of aspirin: Secondary | ICD-10-CM | POA: Diagnosis not present

## 2016-05-09 DIAGNOSIS — N3001 Acute cystitis with hematuria: Secondary | ICD-10-CM | POA: Insufficient documentation

## 2016-05-09 DIAGNOSIS — I10 Essential (primary) hypertension: Secondary | ICD-10-CM | POA: Diagnosis not present

## 2016-05-09 DIAGNOSIS — R1030 Lower abdominal pain, unspecified: Secondary | ICD-10-CM | POA: Diagnosis present

## 2016-05-09 LAB — I-STAT CHEM 8, ED
BUN: 14 mg/dL (ref 6–20)
Calcium, Ion: 1.11 mmol/L — ABNORMAL LOW (ref 1.15–1.40)
Chloride: 103 mmol/L (ref 101–111)
Creatinine, Ser: 0.9 mg/dL (ref 0.44–1.00)
Glucose, Bld: 69 mg/dL (ref 65–99)
HEMATOCRIT: 39 % (ref 36.0–46.0)
HEMOGLOBIN: 13.3 g/dL (ref 12.0–15.0)
POTASSIUM: 4.4 mmol/L (ref 3.5–5.1)
SODIUM: 142 mmol/L (ref 135–145)
TCO2: 29 mmol/L (ref 0–100)

## 2016-05-09 LAB — URINALYSIS, ROUTINE W REFLEX MICROSCOPIC
BILIRUBIN URINE: NEGATIVE
GLUCOSE, UA: NEGATIVE mg/dL
KETONES UR: NEGATIVE mg/dL
Nitrite: POSITIVE — AB
PROTEIN: 30 mg/dL — AB
Specific Gravity, Urine: 1.014 (ref 1.005–1.030)
pH: 7.5 (ref 5.0–8.0)

## 2016-05-09 LAB — URINE MICROSCOPIC-ADD ON

## 2016-05-09 LAB — PREGNANCY, URINE: PREG TEST UR: NEGATIVE

## 2016-05-09 MED ORDER — ACETAMINOPHEN 500 MG PO TABS
1000.0000 mg | ORAL_TABLET | Freq: Once | ORAL | Status: AC
  Administered 2016-05-09: 1000 mg via ORAL
  Filled 2016-05-09: qty 2

## 2016-05-09 MED ORDER — CEPHALEXIN 500 MG PO CAPS: 500.0000 mg | ORAL_CAPSULE | Freq: Four times a day (QID) | ORAL | 0 refills | Status: DC

## 2016-05-09 MED ORDER — HYDROCODONE-ACETAMINOPHEN 5-325 MG PO TABS: 1.0000 | ORAL_TABLET | Freq: Four times a day (QID) | ORAL | 0 refills | Status: DC | PRN

## 2016-05-09 NOTE — ED Provider Notes (Signed)
[facility]-EMERGENCY DEPT Provider Note   CSN: [id number] Arrival date & time: 05/09/16  [id number]     History   Chief Complaint Chief Complaint  Patient presents with  . Abdominal Pain    HPI Melissa Yang is a 45 y.o. female.  Patient presents to the emergency department with chief complaint of lower abdominal pain and dysuria. She states symptoms started about 5 days ago. She reports associated hematuria, urinary frequency, urgency, and foul-smelling urine. She denies history of UTI. She has been treating her symptoms with increased fluids, cranberry juice, and AZO. She reports that she had a fever 4 days ago, but none since. She denies any vomiting. Denies any vaginal discharge bleeding. There are no other associated symptoms.   The history is provided by the patient. No language interpreter was used.    Past Medical History:  Diagnosis Date  . Anemia   . Hypertension     Patient Active Problem List   Diagnosis Date Noted  . Chest pain, unspecified 03/05/2014  . Essential hypertension, benign 03/05/2014    Past Surgical History:  Procedure Laterality Date  . ABDOMINAL HYSTERECTOMY      OB History    No data available       Home Medications    Prior to Admission medications   Medication Sig Start Date End Date Taking? Authorizing Provider  albuterol (VENTOLIN HFA) 108 (90 BASE) MCG/ACT inhaler Inhale 2 puffs into the lungs every 6 (six) hours as needed for wheezing or shortness of breath.    Historical Provider, MD  ALPRAZolam Prudy Feeler(XANAX) 1 MG tablet Take 1 mg by mouth daily as needed for anxiety.    Historical Provider, MD  amLODipine-olmesartan (AZOR) 5-20 MG per tablet One twice daily 03/05/14   Nyoka CowdenMichael B Wert, MD  amoxicillin (AMOXIL) 500 MG capsule Take 1 capsule (500 mg total) by mouth 2 (two) times daily. 08/22/15   Samantha Tripp Dowless, PA-C  aspirin 81 MG tablet Take 81 mg by mouth daily.    Historical Provider, MD  traMADol Janean Sark(ULTRAM) 50 MG tablet 1-2 every 4  hours as needed for cough or pain 03/05/14   Nyoka CowdenMichael B Wert, MD    Family History Family History  Problem Relation Age of Onset  . Emphysema Mother     smoked  . Heart disease Mother   . Hypertension Mother   . Diabetes Mother   . Heart failure Mother   . Heart disease Father   . Diabetes Father   . Hypertension Father   . Breast cancer Maternal Grandmother   . Pancreatic cancer Paternal Grandmother     Social History Social History  Substance Use Topics  . Smoking status: Never Smoker  . Smokeless tobacco: Never Used  . Alcohol use No     Allergies   Patient has no known allergies.   Review of Systems Review of Systems  Gastrointestinal: Positive for abdominal pain.  Genitourinary: Positive for dysuria, frequency and urgency.  All other systems reviewed and are negative.    Physical Exam Updated Vital Signs BP 152/95   Pulse 79   Temp 97.4 F (36.3 C) (Oral)   Resp 16   Ht 5\' 6"  (1.676 m)   Wt 74.8 kg   SpO2 100%   BMI 26.63 kg/m   Physical Exam  Constitutional: She is oriented to person, place, and time. She appears well-developed and well-nourished.  HENT:  Head: Normocephalic and atraumatic.  Eyes: Conjunctivae and EOM are normal. Pupils are equal, round, and  reactive to light.  Neck: Normal range of motion. Neck supple.  Cardiovascular: Normal rate and regular rhythm.  Exam reveals no gallop and no friction rub.   No murmur heard. Pulmonary/Chest: Effort normal and breath sounds normal. No respiratory distress. She has no wheezes. She has no rales. She exhibits no tenderness.  Abdominal: Soft. Bowel sounds are normal. She exhibits no distension and no mass. There is tenderness. There is no rebound and no guarding.  Lower abdomen is moderately tender to palpation, with some significant tenderness right lower quadrant, no other focal abdominal tenderness  Musculoskeletal: Normal range of motion. She exhibits no edema or tenderness.  Neurological: She  is alert and oriented to person, place, and time.  Skin: Skin is warm and dry.  Psychiatric: She has a normal mood and affect. Her behavior is normal. Judgment and thought content normal.  Nursing note and vitals reviewed.    ED Treatments / Results  Labs (all labs ordered are listed, but only abnormal results are displayed) Labs Reviewed  URINALYSIS, ROUTINE W REFLEX MICROSCOPIC (NOT [facility])  PREGNANCY, URINE  I-STAT CHEM 8, ED    EKG  EKG Interpretation None       Radiology No results found.  Procedures Procedures (including critical care time)  Medications Ordered in ED Medications - No data to display   Initial Impression / Assessment and Plan / ED Course  I have reviewed the triage vital signs and the nursing notes.  Pertinent labs & imaging results that were available during my care of the patient were reviewed by me and considered in my medical decision making (see chart for details).  Clinical Course     Patient with supra pubic abdominal pain and right lower quadrant abdominal pain. Reports associated dysuria, urinary frequency, and urgency. Urinalysis is consistent with UTI. Creatinine is normal. Vital signs are stable. Symptoms and lab findings seem consistent with classic UTI. I doubt appendicitis. However, she does have some mild right lower quadrant abdominal pain. I have encouraged patient to return if the symptoms worsen despite treatment of the UTI. She understands agrees the plan. She is stable and ready for discharge.  Final Clinical Impressions(s) / ED Diagnoses   Final diagnoses:  Acute cystitis with hematuria    New Prescriptions New Prescriptions   CEPHALEXIN (KEFLEX) 500 MG CAPSULE    Take 1 capsule (500 mg total) by mouth 4 (four) times daily.   HYDROCODONE-ACETAMINOPHEN (NORCO/VICODIN) 5-325 MG TABLET    Take 1 tablet by mouth every 6 (six) hours as needed.     Roxy Horsemanobert Jocelyn Lowery, PA-C 05/09/16 0930    Loren Raceravid [place], MD 05/10/16  1149

## 2016-05-09 NOTE — ED Triage Notes (Signed)
Pt c/o LRQ ABD pain for the past 2 days. Pt endorse pain when urinating and foul smelling urine.

## 2016-07-16 ENCOUNTER — Emergency Department (HOSPITAL_COMMUNITY)
Admission: EM | Admit: 2016-07-16 | Discharge: 2016-07-16 | Disposition: A | Discharge status: Home / Self Care | Payer: BLUE CROSS/BLUE SHIELD

## 2016-07-16 NOTE — ED Notes (Signed)
Pt in triage now and states that she doesn't want to stay.  States she wants to go home and lay down.  Pt was not triaged.

## 2017-03-16 ENCOUNTER — Other Ambulatory Visit: Payer: Self-pay | Admitting: Otolaryngology

## 2017-03-16 DIAGNOSIS — H9202 Otalgia, left ear: Secondary | ICD-10-CM

## 2017-03-16 DIAGNOSIS — R5381 Other malaise: Secondary | ICD-10-CM

## 2017-03-22 ENCOUNTER — Other Ambulatory Visit: Payer: BLUE CROSS/BLUE SHIELD

## 2017-04-03 ENCOUNTER — Ambulatory Visit
Admission: RE | Admit: 2017-04-03 | Discharge: 2017-04-03 | Disposition: A | Discharge status: Home / Self Care | Payer: BLUE CROSS/BLUE SHIELD | Source: Ambulatory Visit | Attending: Otolaryngology | Admitting: Otolaryngology

## 2017-04-03 DIAGNOSIS — H9202 Otalgia, left ear: Secondary | ICD-10-CM

## 2017-10-13 ENCOUNTER — Encounter (HOSPITAL_COMMUNITY): Payer: Self-pay

## 2017-10-13 ENCOUNTER — Other Ambulatory Visit: Payer: Self-pay

## 2017-10-13 ENCOUNTER — Emergency Department (HOSPITAL_COMMUNITY)
Admission: EM | Admit: 2017-10-13 | Discharge: 2017-10-13 | Disposition: A | Discharge status: Home / Self Care | Payer: BLUE CROSS/BLUE SHIELD | Attending: Emergency Medicine | Admitting: Emergency Medicine

## 2017-10-13 DIAGNOSIS — R350 Frequency of micturition: Secondary | ICD-10-CM | POA: Diagnosis not present

## 2017-10-13 DIAGNOSIS — R1084 Generalized abdominal pain: Secondary | ICD-10-CM | POA: Insufficient documentation

## 2017-10-13 DIAGNOSIS — Z5321 Procedure and treatment not carried out due to patient leaving prior to being seen by health care provider: Secondary | ICD-10-CM | POA: Diagnosis not present

## 2017-10-13 LAB — BASIC METABOLIC PANEL
ANION GAP: 6 (ref 5–15)
BUN: 7 mg/dL (ref 6–20)
CALCIUM: 8.3 mg/dL — AB (ref 8.9–10.3)
CO2: 25 mmol/L (ref 22–32)
CREATININE: 0.83 mg/dL (ref 0.44–1.00)
Chloride: 104 mmol/L (ref 101–111)
GFR calc Af Amer: >60 mL/min (ref >60)
GFR calc non Af Amer: >60 mL/min (ref >60)
Glucose, Bld: 106 mg/dL — ABNORMAL HIGH (ref 65–99)
Potassium: 3 mmol/L — ABNORMAL LOW (ref 3.5–5.1)
SODIUM: 135 mmol/L (ref 135–145)

## 2017-10-13 LAB — URINALYSIS, ROUTINE W REFLEX MICROSCOPIC
BILIRUBIN URINE: NEGATIVE
Glucose, UA: NEGATIVE mg/dL
Hgb urine dipstick: NEGATIVE
KETONES UR: NEGATIVE mg/dL
LEUKOCYTES UA: NEGATIVE
Nitrite: NEGATIVE
PH: 7 (ref 5.0–8.0)
Protein, ur: NEGATIVE mg/dL
SPECIFIC GRAVITY, URINE: 1.013 (ref 1.005–1.030)

## 2017-10-13 LAB — CBC
HCT: 36.9 % (ref 36.0–46.0)
HEMOGLOBIN: 12.3 g/dL (ref 12.0–15.0)
MCH: 31 pg (ref 26.0–34.0)
MCHC: 33.3 g/dL (ref 30.0–36.0)
MCV: 92.9 fL (ref 78.0–100.0)
PLATELETS: 276 10*3/uL (ref 150–400)
RBC: 3.97 MIL/uL (ref 3.87–5.11)
RDW: 13.3 % (ref 11.5–15.5)
WBC: 2.8 10*3/uL — AB (ref 4.0–10.5)

## 2017-10-13 NOTE — ED Notes (Signed)
Pt stated she was leaving to be seen at urgent care.

## 2017-10-13 NOTE — ED Triage Notes (Signed)
Pt c/o left sided flank pain X2-3 days. Pt denies painful urination, has "been going more frequent".

## 2018-05-17 ENCOUNTER — Encounter (HOSPITAL_COMMUNITY): Payer: Self-pay | Admitting: Emergency Medicine

## 2018-05-17 ENCOUNTER — Other Ambulatory Visit: Payer: Self-pay

## 2018-05-17 ENCOUNTER — Emergency Department (HOSPITAL_COMMUNITY)
Admission: EM | Admit: 2018-05-17 | Discharge: 2018-05-17 | Disposition: A | Discharge status: Home / Self Care | Payer: BLUE CROSS/BLUE SHIELD | Attending: Emergency Medicine | Admitting: Emergency Medicine

## 2018-05-17 DIAGNOSIS — I10 Essential (primary) hypertension: Secondary | ICD-10-CM | POA: Insufficient documentation

## 2018-05-17 DIAGNOSIS — H6690 Otitis media, unspecified, unspecified ear: Secondary | ICD-10-CM | POA: Insufficient documentation

## 2018-05-17 DIAGNOSIS — D649 Anemia, unspecified: Secondary | ICD-10-CM | POA: Diagnosis not present

## 2018-05-17 DIAGNOSIS — Z79899 Other long term (current) drug therapy: Secondary | ICD-10-CM | POA: Diagnosis not present

## 2018-05-17 DIAGNOSIS — H9202 Otalgia, left ear: Secondary | ICD-10-CM | POA: Diagnosis present

## 2018-05-17 HISTORY — DX: Otitis media, unspecified, unspecified ear: H66.90

## 2018-05-17 MED ORDER — FLUCONAZOLE 150 MG PO TABS: ORAL_TABLET | ORAL | 0 refills | Status: AC

## 2018-05-17 MED ORDER — AMOXICILLIN 500 MG PO CAPS: 1000.0000 mg | ORAL_CAPSULE | Freq: Three times a day (TID) | ORAL | 0 refills | Status: AC

## 2018-05-17 MED ORDER — HYDROCODONE-ACETAMINOPHEN 5-325 MG PO TABS: 1.0000 | ORAL_TABLET | Freq: Four times a day (QID) | ORAL | 0 refills | Status: AC | PRN

## 2018-05-17 NOTE — ED Provider Notes (Addendum)
[facility]-EMERGENCY DEPT Provider Note: Lowella Dell, MD, FACEP  CSN: [id number] MRN: [id number] ARRIVAL: 05/17/18 at 0151 ROOM: WA22/WA22   CHIEF COMPLAINT  Ear Pain   HISTORY OF PRESENT ILLNESS  05/17/18 2:26 AM Melissa Yang is a 47 y.o. female with a history of otitis media.  She is here with a one-week history of pain in her left ear that has acutely worsened over the last day.  She rates her pain as a 7 out of 10.  She has gotten inadequate pain relief with ibuprofen.  She denies fever or drainage from that ear.    Past Medical History:  Diagnosis Date  . Anemia   . Hypertension   . Otitis media     Past Surgical History:  Procedure Laterality Date  . ABDOMINAL HYSTERECTOMY      Family History  Problem Relation Age of Onset  . Emphysema Mother        smoked  . Heart disease Mother   . Hypertension Mother   . Diabetes Mother   . Heart failure Mother   . Heart disease Father   . Diabetes Father   . Hypertension Father   . Breast cancer Maternal Grandmother   . Pancreatic cancer Paternal Grandmother     Social History   Tobacco Use  . Smoking status: Never Smoker  . Smokeless tobacco: Never Used  Substance Use Topics  . Alcohol use: No  . Drug use: No    Prior to Admission medications   Medication Sig Start Date End Date Taking? Authorizing Provider  albuterol (VENTOLIN HFA) 108 (90 BASE) MCG/ACT inhaler Inhale 2 puffs into the lungs every 6 (six) hours as needed for wheezing or shortness of breath.    [provider]  ALPRAZolam Prudy Feeler) 1 MG tablet Take 1 mg by mouth daily as needed for anxiety.    [provider]  amLODipine-olmesartan (AZOR) 5-20 MG per tablet One twice daily Patient not taking: Reported on 05/09/2016 03/05/14   Nyoka Cowden, MD  amoxicillin (AMOXIL) 500 MG capsule Take 1 capsule (500 mg total) by mouth 2 (two) times daily. Patient not taking: Reported on 05/09/2016 08/22/15   Dowless, Lelon Mast Tripp, PA-C    cephALEXin (KEFLEX) 500 MG capsule Take 1 capsule (500 mg total) by mouth 4 (four) times daily. 05/09/16   Roxy Horseman, PA-C  fluticasone (FLONASE) 50 MCG/ACT nasal spray Place 2 sprays into both nostrils daily as needed for allergies. 08/12/14   [provider]  HYDROcodone-acetaminophen (NORCO/VICODIN) 5-325 MG tablet Take 1 tablet by mouth every 6 (six) hours as needed. 05/09/16   Roxy Horseman, PA-C  Multiple Vitamin (MULTIVITAMIN) tablet Take 1 tablet by mouth daily.    [provider]  traMADol (ULTRAM) 50 MG tablet 1-2 every 4 hours as needed for cough or pain Patient not taking: Reported on 05/09/2016 03/05/14   Nyoka Cowden, MD    Allergies Patient has no known allergies.   REVIEW OF SYSTEMS  Negative except as noted here or in the History of Present Illness.   PHYSICAL EXAMINATION  Initial Vital Signs Blood pressure (!) 155/102, temperature (!) 97.1 F (36.2 C), temperature source Oral, resp. rate 16, SpO2 100 %.  Examination General: Well-developed, well-nourished female in no acute distress; appearance consistent with age of record HENT: normocephalic; atraumatic; right TM normal, left TM erythematous Eyes: pupils equal, round and reactive to light; extraocular muscles intact Neck: supple Heart: regular rate and rhythm Lungs: clear to auscultation bilaterally Abdomen: soft;  nondistended; nontender; bowel sounds present Extremities: No deformity; full range of motion Neurologic: Awake, alert and oriented; motor function intact in all extremities and symmetric; no facial droop Skin: Warm and dry Psychiatric: Normal mood and affect   RESULTS  Summary of this visit's results, reviewed by myself:   EKG Interpretation  Date/Time:    Ventricular Rate:    PR Interval:    QRS Duration:   QT Interval:    QTC Calculation:   R Axis:     Text Interpretation:        Laboratory Studies: No results found for this or any previous visit (from  the past 24 hour(s)). Imaging Studies: No results found.  ED COURSE and MDM  Nursing notes and initial vitals signs, including pulse oximetry, reviewed.  Vitals:   05/17/18 0214  BP: (!) 155/102  Resp: 16  Temp: (!) 97.1 F (36.2 C)  TempSrc: Oral  SpO2: 100%   Consultation with the [place] state controlled substances database reveals the patient has received no opioid prescriptions in the past 2 years.  PROCEDURES    ED DIAGNOSES     ICD-10-CM   1. Otitis media without spontaneous rupture of tympanic membrane H66.90        Massiah Longanecker, Jonny RuizJohn, MD 05/17/18 [id number]    Paula LibraMolpus, Kayston Jodoin, MD 05/17/18 0236

## 2018-05-17 NOTE — ED Triage Notes (Signed)
Pt from home with c/o left ear pain. Pt has hx of ruptured tympanic membrane about 1 year ago and recurrent ear infections. Ear canal appears reddened.

## 2018-06-18 ENCOUNTER — Encounter (HOSPITAL_COMMUNITY): Payer: Self-pay | Admitting: Emergency Medicine

## 2018-06-18 ENCOUNTER — Emergency Department (HOSPITAL_COMMUNITY)
Admission: EM | Admit: 2018-06-18 | Discharge: 2018-06-18 | Disposition: A | Discharge status: Home / Self Care | Payer: BLUE CROSS/BLUE SHIELD | Attending: Emergency Medicine | Admitting: Emergency Medicine

## 2018-06-18 ENCOUNTER — Emergency Department (HOSPITAL_COMMUNITY): Payer: BLUE CROSS/BLUE SHIELD

## 2018-06-18 DIAGNOSIS — J069 Acute upper respiratory infection, unspecified: Secondary | ICD-10-CM | POA: Diagnosis not present

## 2018-06-18 DIAGNOSIS — Z79899 Other long term (current) drug therapy: Secondary | ICD-10-CM | POA: Diagnosis not present

## 2018-06-18 DIAGNOSIS — I10 Essential (primary) hypertension: Secondary | ICD-10-CM | POA: Diagnosis not present

## 2018-06-18 DIAGNOSIS — R079 Chest pain, unspecified: Secondary | ICD-10-CM | POA: Diagnosis present

## 2018-06-18 LAB — CBC
HEMATOCRIT: 37.3 % (ref 36.0–46.0)
Hemoglobin: 11.9 g/dL — ABNORMAL LOW (ref 12.0–15.0)
MCH: 30.4 pg (ref 26.0–34.0)
MCHC: 31.9 g/dL (ref 30.0–36.0)
MCV: 95.4 fL (ref 80.0–100.0)
NRBC: 0 % (ref 0.0–0.2)
Platelets: 283 10*3/uL (ref 150–400)
RBC: 3.91 MIL/uL (ref 3.87–5.11)
RDW: 13.7 % (ref 11.5–15.5)
WBC: 3.9 10*3/uL — ABNORMAL LOW (ref 4.0–10.5)

## 2018-06-18 LAB — BASIC METABOLIC PANEL
Anion gap: 8 (ref 5–15)
BUN: 15 mg/dL (ref 6–20)
CALCIUM: 8.5 mg/dL — AB (ref 8.9–10.3)
CHLORIDE: 105 mmol/L (ref 98–111)
CO2: 27 mmol/L (ref 22–32)
Creatinine, Ser: 0.93 mg/dL (ref 0.44–1.00)
GFR calc Af Amer: >60 mL/min (ref >60)
GFR calc non Af Amer: >60 mL/min (ref >60)
GLUCOSE: 92 mg/dL (ref 70–99)
Potassium: 3.9 mmol/L (ref 3.5–5.1)
Sodium: 140 mmol/L (ref 135–145)

## 2018-06-18 LAB — I-STAT TROPONIN, ED: Troponin i, poc: 0 ng/mL (ref 0.00–0.08)

## 2018-06-18 MED ORDER — ALBUTEROL SULFATE HFA 108 (90 BASE) MCG/ACT IN AERS
2.0000 | INHALATION_SPRAY | Freq: Once | RESPIRATORY_TRACT | Status: AC
  Administered 2018-06-18: 2 via RESPIRATORY_TRACT
  Filled 2018-06-18: qty 6.7

## 2018-06-18 NOTE — ED Provider Notes (Signed)
[facility]-EMERGENCY DEPT Provider Note   CSN: [id number] Arrival date & time: 06/18/18  4098     History   Chief Complaint Chief Complaint  Patient presents with  . Chest Pain    HPI Shekinah Pitones is a 47 y.o. female.  The history is provided by the patient.  Chest Pain   This is a new problem. The current episode started 3 to 5 hours ago. The problem occurs rarely. The problem has been resolved. The pain is associated with coughing. The pain is present in the substernal region. The pain is at a severity of 2/10. The pain is mild. The quality of the pain is described as dull. The pain does not radiate. Associated symptoms include cough. Pertinent negatives include no abdominal pain, no back pain, no exertional chest pressure, no fever, no headaches, no hemoptysis, no leg pain, no malaise/fatigue, no nausea, no near-syncope, no orthopnea, no palpitations, no shortness of breath, no syncope and no vomiting. She has tried nothing for the symptoms.  Her past medical history is significant for hypertension.  Pertinent negatives for past medical history include no CHF, no diabetes, no hyperlipidemia and no seizures.    Past Medical History:  Diagnosis Date  . Anemia   . Hypertension   . Otitis media     Patient Active Problem List   Diagnosis Date Noted  . Chest pain, unspecified 03/05/2014  . Essential hypertension, benign 03/05/2014    Past Surgical History:  Procedure Laterality Date  . ABDOMINAL HYSTERECTOMY       OB History   No obstetric history on file.      Home Medications    Prior to Admission medications   Medication Sig Start Date End Date Taking? Authorizing Provider  amLODipine (NORVASC) 5 MG tablet Take 5 mg by mouth 2 (two) times daily.   Yes [provider]  Ibuprofen-diphenhydrAMINE Cit (IBUPROFEN PM PO) Take 2 tablets by mouth at bedtime.   Yes [provider]  albuterol (VENTOLIN HFA) 108 (90 BASE)  MCG/ACT inhaler Inhale 2 puffs into the lungs every 6 (six) hours as needed for wheezing or shortness of breath.    [provider]  ALPRAZolam Prudy Feeler) 1 MG tablet Take 1 mg by mouth daily as needed for anxiety.    [provider]  fluconazole (DIFLUCAN) 150 MG tablet Take 1 tablet as needed for vaginal yeast infection.  May repeat in 3 days if symptoms persist. Patient not taking: Reported on 06/18/2018 05/17/18   Molpus, Jonny Ruiz, MD  fluticasone (FLONASE) 50 MCG/ACT nasal spray Place 2 sprays into both nostrils daily as needed for allergies. 08/12/14   [provider]  HYDROcodone-acetaminophen (NORCO) 5-325 MG tablet Take 1 tablet by mouth every 6 (six) hours as needed (for pain). Patient not taking: Reported on 06/18/2018 05/17/18   Molpus, John, MD  Multiple Vitamin (MULTIVITAMIN) tablet Take 1 tablet by mouth daily.    [provider]    Family History Family History  Problem Relation Age of Onset  . Emphysema Mother        smoked  . Heart disease Mother   . Hypertension Mother   . Diabetes Mother   . Heart failure Mother   . Heart disease Father   . Diabetes Father   . Hypertension Father   . Breast cancer Maternal Grandmother   . Pancreatic cancer Paternal Grandmother     Social History Social History   Tobacco Use  . Smoking status: Never Smoker  .  Smokeless tobacco: Never Used  Substance Use Topics  . Alcohol use: No  . Drug use: No     Allergies   Patient has no known allergies.   Review of Systems Review of Systems  Constitutional: Negative for chills, fever and malaise/fatigue.  HENT: Negative for ear pain and sore throat.   Eyes: Negative for pain and visual disturbance.  Respiratory: Positive for cough. Negative for hemoptysis and shortness of breath.   Cardiovascular: Positive for chest pain. Negative for palpitations, orthopnea, syncope and near-syncope.  Gastrointestinal: Negative for abdominal pain, nausea and  vomiting.  Genitourinary: Negative for dysuria and hematuria.  Musculoskeletal: Negative for arthralgias and back pain.  Skin: Negative for color change and rash.  Neurological: Negative for seizures, syncope and headaches.  All other systems reviewed and are negative.    Physical Exam Updated Vital Signs  ED Triage Vitals  Enc Vitals Group     BP 06/18/18 1004 (!) 153/102     Pulse Rate 06/18/18 1004 96     Resp 06/18/18 1004 18     Temp 06/18/18 1004 98.7 F (37.1 C)     Temp Source 06/18/18 1004 Oral     SpO2 06/18/18 1004 100 %     Weight 06/18/18 1008 190 lb (86.2 kg)     Height 06/18/18 1008 5\' 6"  (1.676 m)     Head Circumference --      Peak Flow --      Pain Score 06/18/18 1008 5     Pain Loc --      Pain Edu? --      Excl. in GC? --     Physical Exam Vitals signs and nursing note reviewed.  Constitutional:      General: She is not in acute distress.    Appearance: She is well-developed.  HENT:     Head: Normocephalic and atraumatic.  Eyes:     Extraocular Movements: Extraocular movements intact.     Conjunctiva/sclera: Conjunctivae normal.     Pupils: Pupils are equal, round, and reactive to light.  Neck:     Musculoskeletal: Normal range of motion and neck supple.  Cardiovascular:     Rate and Rhythm: Normal rate and regular rhythm.     Heart sounds: Normal heart sounds. No murmur.  Pulmonary:     Effort: Pulmonary effort is normal. No respiratory distress.     Breath sounds: Wheezing (mild, scattered) present. No decreased breath sounds, rhonchi or rales.  Abdominal:     Palpations: Abdomen is soft.     Tenderness: There is no abdominal tenderness.  Musculoskeletal: Normal range of motion.     Right lower leg: No edema.     Left lower leg: No edema.  Skin:    General: Skin is warm and dry.  Neurological:     General: No focal deficit present.     Mental Status: She is alert.  Psychiatric:        Mood and Affect: Mood normal.      ED  Treatments / Results  Labs (all labs ordered are listed, but only abnormal results are displayed) Labs Reviewed  BASIC METABOLIC PANEL - Abnormal; Notable for the following components:      Result Value   Calcium 8.5 (*)    All other components within normal limits  CBC - Abnormal; Notable for the following components:   WBC 3.9 (*)    Hemoglobin 11.9 (*)    All other components within normal limits  I-STAT TROPONIN, ED    EKG EKG Interpretation  Date/Time:  Tuesday June 18 2018 10:03:28 EST Ventricular Rate:  95 PR Interval:    QRS Duration: 90 QT Interval:  368 QTC Calculation: 463 R Axis:   34 Text Interpretation:  Sinus rhythm Biatrial enlargement Borderline T wave abnormalities Baseline wander in lead(s) V6 Confirmed by Virgina NorfolkAdam, Khristen Cheyney [phone]([id number]) on 06/18/2018 12:00:10 PM   Radiology Dg Chest 2 View  Result Date: 06/18/2018 CLINICAL DATA:  Chest pain EXAM: CHEST - 2 VIEW COMPARISON:  Chest radiograph and chest CT March 01, 2014 FINDINGS: There is no edema or consolidation. The heart size and pulmonary vascularity are normal. No adenopathy. No pneumothorax. No bone lesions. IMPRESSION: No edema or consolidation. Electronically Signed   By: Bretta BangWilliam  Woodruff III M.D.   On: 06/18/2018 10:21    Procedures Procedures (including critical care time)  Medications Ordered in ED Medications  albuterol (PROVENTIL HFA;VENTOLIN HFA) 108 (90 Base) MCG/ACT inhaler 2 puff (2 puffs Inhalation Given 06/18/18 1130)     Initial Impression / Assessment and Plan / ED Course  I have reviewed the triage vital signs and the nursing notes.  Pertinent labs & imaging results that were available during my care of the patient were reviewed by me and considered in my medical decision making (see chart for details).     Audery Amelikki Tilly is a 47 year old female with history of hypertension who presents to the ED with chest pain, cough.  Patient with normal vitals.  No fever.  Patient was  symptoms that started this morning.  Patient appears to have some upper respiratory symptoms.  Has had a cough.  She has some mild wheezing on exam.  Has needed an inhaler in the past for bronchitis.  No history of asthma or COPD.  No signs of volume overload on exam.  EKG shows sinus rhythm with no signs of ischemic changes.  Troponin normal.  No significant electrolyte abnormality, kidney injury, leukocytosis.  Chest x-ray with no signs of pneumonia, pneumothorax, pleural effusion.  Patient with noncardiac type chest pain.  No concern for PE as patient is PERC negative.  Suspect patient likely with viral process.  Given albuterol inhaler for symptomatic relief.  Recommend Tylenol Motrin as needed as well.  Recommend follow-up with primary care doctor and told to return to the ED if symptoms worsen.  This chart was dictated using voice recognition software.  Despite best efforts to proofread,  errors can occur which can change the documentation meaning.   Final Clinical Impressions(s) / ED Diagnoses   Final diagnoses:  Upper respiratory tract infection, unspecified type    ED Discharge Orders    None       Virgina NorfolkCuratolo, Kassaundra Hair, DO 06/18/18 1201

## 2018-06-18 NOTE — ED Triage Notes (Signed)
Pt c/o central chest pains that started this morning with SOB.

## 2018-06-26 ENCOUNTER — Other Ambulatory Visit: Payer: Self-pay

## 2018-06-26 ENCOUNTER — Encounter (HOSPITAL_COMMUNITY): Payer: Self-pay | Admitting: Emergency Medicine

## 2018-06-26 ENCOUNTER — Emergency Department (HOSPITAL_COMMUNITY)
Admission: EM | Admit: 2018-06-26 | Discharge: 2018-06-26 | Discharge status: Against Medical Advice | Payer: BLUE CROSS/BLUE SHIELD | Attending: Emergency Medicine | Admitting: Emergency Medicine

## 2018-06-26 DIAGNOSIS — H9202 Otalgia, left ear: Secondary | ICD-10-CM | POA: Diagnosis not present

## 2018-06-26 DIAGNOSIS — Z5321 Procedure and treatment not carried out due to patient leaving prior to being seen by health care provider: Secondary | ICD-10-CM | POA: Diagnosis not present

## 2018-06-26 NOTE — ED Notes (Signed)
Bed: [facility] Expected date:  Expected time:  Means of arrival:  Comments: 

## 2018-06-26 NOTE — ED Notes (Signed)
Pt gave labels to registration and stated she was leaving. Left before writer could talk to them.

## 2018-06-26 NOTE — ED Triage Notes (Signed)
Pt complaint of left ear pain and unable to hear out of that ear. Seen last month for earache and completed antibiotic; new onset swelling and difficulty hearing.

## 2018-10-01 ENCOUNTER — Emergency Department (HOSPITAL_COMMUNITY)
Admission: EM | Admit: 2018-10-01 | Discharge: 2018-10-01 | Disposition: A | Discharge status: Home / Self Care | Payer: BLUE CROSS/BLUE SHIELD | Attending: Emergency Medicine | Admitting: Emergency Medicine

## 2018-10-01 ENCOUNTER — Emergency Department (HOSPITAL_COMMUNITY): Payer: BLUE CROSS/BLUE SHIELD

## 2018-10-01 ENCOUNTER — Other Ambulatory Visit: Payer: Self-pay

## 2018-10-01 ENCOUNTER — Emergency Department (EMERGENCY_DEPARTMENT_HOSPITAL)
Admission: EM | Admit: 2018-10-01 | Discharge: 2018-10-01 | Disposition: A | Discharge status: Home / Self Care | Payer: BLUE CROSS/BLUE SHIELD | Source: Home / Self Care | Attending: Emergency Medicine | Admitting: Emergency Medicine

## 2018-10-01 DIAGNOSIS — R531 Weakness: Secondary | ICD-10-CM | POA: Diagnosis not present

## 2018-10-01 DIAGNOSIS — M629 Disorder of muscle, unspecified: Secondary | ICD-10-CM | POA: Insufficient documentation

## 2018-10-01 DIAGNOSIS — R51 Headache: Secondary | ICD-10-CM | POA: Insufficient documentation

## 2018-10-01 DIAGNOSIS — R479 Unspecified speech disturbances: Secondary | ICD-10-CM

## 2018-10-01 DIAGNOSIS — I10 Essential (primary) hypertension: Secondary | ICD-10-CM | POA: Insufficient documentation

## 2018-10-01 DIAGNOSIS — E876 Hypokalemia: Secondary | ICD-10-CM | POA: Diagnosis not present

## 2018-10-01 DIAGNOSIS — Z79899 Other long term (current) drug therapy: Secondary | ICD-10-CM | POA: Insufficient documentation

## 2018-10-01 LAB — DIFFERENTIAL
Abs Immature Granulocytes: 0.02 10*3/uL (ref 0.00–0.07)
Abs Immature Granulocytes: 0 10*3/uL (ref 0.00–0.07)
Basophils Absolute: 0 10*3/uL (ref 0.0–0.1)
Basophils Absolute: 0 10*3/uL (ref 0.0–0.1)
Basophils Relative: 1 %
Basophils Relative: 0 %
Eosinophils Absolute: 0.1 10*3/uL (ref 0.0–0.5)
Eosinophils Absolute: 0.1 10*3/uL (ref 0.0–0.5)
Eosinophils Relative: 3 %
Eosinophils Relative: 2 %
Immature Granulocytes: 1 %
Immature Granulocytes: 0 %
Lymphocytes Relative: 39 %
Lymphocytes Relative: 38 %
Lymphs Abs: 1.4 10*3/uL (ref 0.7–4.0)
Lymphs Abs: 1.3 10*3/uL (ref 0.7–4.0)
Monocytes Absolute: 0.5 10*3/uL (ref 0.1–1.0)
Monocytes Absolute: 0.3 10*3/uL (ref 0.1–1.0)
Monocytes Relative: 13 %
Monocytes Relative: 10 %
Neutro Abs: 1.6 10*3/uL — ABNORMAL LOW (ref 1.7–7.7)
Neutro Abs: 1.7 10*3/uL (ref 1.7–7.7)
Neutrophils Relative %: 43 %
Neutrophils Relative %: 50 %

## 2018-10-01 LAB — I-STAT BETA HCG BLOOD, ED (MC, WL, AP ONLY)
I-stat hCG, quantitative: <5 m[IU]/mL (ref <5)
I-stat hCG, quantitative: <5 m[IU]/mL (ref <5)

## 2018-10-01 LAB — COMPREHENSIVE METABOLIC PANEL
ALT: 17 U/L (ref 0–44)
ALT: 17 U/L (ref 0–44)
AST: 22 U/L (ref 15–41)
AST: 23 U/L (ref 15–41)
Albumin: 3.7 g/dL (ref 3.5–5.0)
Albumin: 3.6 g/dL (ref 3.5–5.0)
Alkaline Phosphatase: 59 U/L (ref 38–126)
Alkaline Phosphatase: 57 U/L (ref 38–126)
Anion gap: 11 (ref 5–15)
Anion gap: 10 (ref 5–15)
BUN: 13 mg/dL (ref 6–20)
BUN: 12 mg/dL (ref 6–20)
CO2: 24 mmol/L (ref 22–32)
CO2: 26 mmol/L (ref 22–32)
Calcium: 8.9 mg/dL (ref 8.9–10.3)
Calcium: 8.8 mg/dL — ABNORMAL LOW (ref 8.9–10.3)
Chloride: 107 mmol/L (ref 98–111)
Chloride: 107 mmol/L (ref 98–111)
Creatinine, Ser: 1.27 mg/dL — ABNORMAL HIGH (ref 0.44–1.00)
Creatinine, Ser: 1.09 mg/dL — ABNORMAL HIGH (ref 0.44–1.00)
GFR calc Af Amer: 58 mL/min — ABNORMAL LOW (ref >60)
GFR calc Af Amer: >60 mL/min (ref >60)
GFR calc non Af Amer: 50 mL/min — ABNORMAL LOW (ref >60)
GFR calc non Af Amer: 60 mL/min — ABNORMAL LOW (ref >60)
Glucose, Bld: 93 mg/dL (ref 70–99)
Glucose, Bld: 94 mg/dL (ref 70–99)
Potassium: 2.8 mmol/L — ABNORMAL LOW (ref 3.5–5.1)
Potassium: 2.8 mmol/L — ABNORMAL LOW (ref 3.5–5.1)
Sodium: 142 mmol/L (ref 135–145)
Sodium: 143 mmol/L (ref 135–145)
Total Bilirubin: 0.5 mg/dL (ref 0.3–1.2)
Total Bilirubin: 0.4 mg/dL (ref 0.3–1.2)
Total Protein: 7.4 g/dL (ref 6.5–8.1)
Total Protein: 7.2 g/dL (ref 6.5–8.1)

## 2018-10-01 LAB — CBC
HCT: 37.1 % (ref 36.0–46.0)
HCT: 35.5 % — ABNORMAL LOW (ref 36.0–46.0)
Hemoglobin: 11.5 g/dL — ABNORMAL LOW (ref 12.0–15.0)
Hemoglobin: 11 g/dL — ABNORMAL LOW (ref 12.0–15.0)
MCH: 29.3 pg (ref 26.0–34.0)
MCH: 29.6 pg (ref 26.0–34.0)
MCHC: 31 g/dL (ref 30.0–36.0)
MCHC: 31 g/dL (ref 30.0–36.0)
MCV: 94.4 fL (ref 80.0–100.0)
MCV: 95.4 fL (ref 80.0–100.0)
Platelets: 297 10*3/uL (ref 150–400)
Platelets: 274 10*3/uL (ref 150–400)
RBC: 3.93 MIL/uL (ref 3.87–5.11)
RBC: 3.72 MIL/uL — ABNORMAL LOW (ref 3.87–5.11)
RDW: 13.7 % (ref 11.5–15.5)
RDW: 13.8 % (ref 11.5–15.5)
WBC: 3.7 10*3/uL — ABNORMAL LOW (ref 4.0–10.5)
WBC: 3.4 10*3/uL — ABNORMAL LOW (ref 4.0–10.5)
nRBC: 0 % (ref 0.0–0.2)
nRBC: 0 % (ref 0.0–0.2)

## 2018-10-01 LAB — PROTIME-INR
INR: 1 (ref 0.8–1.2)
INR: 1.1 (ref 0.8–1.2)
Prothrombin Time: 13.4 seconds (ref 11.4–15.2)
Prothrombin Time: 13.7 seconds (ref 11.4–15.2)

## 2018-10-01 LAB — I-STAT CREATININE, ED
Creatinine, Ser: 1.2 mg/dL — ABNORMAL HIGH (ref 0.44–1.00)
Creatinine, Ser: 1 mg/dL (ref 0.44–1.00)

## 2018-10-01 LAB — APTT
aPTT: 28 seconds (ref 24–36)
aPTT: 27 seconds (ref 24–36)

## 2018-10-01 LAB — CBG MONITORING, ED: Glucose-Capillary: 84 mg/dL (ref 70–99)

## 2018-10-01 LAB — ETHANOL: Alcohol, Ethyl (B): <10 mg/dL (ref <10)

## 2018-10-01 MED ORDER — SODIUM CHLORIDE 0.9% FLUSH
3.0000 mL | Freq: Once | INTRAVENOUS | Status: AC
  Administered 2018-10-01: 08:00:00 3 mL via INTRAVENOUS

## 2018-10-01 MED ORDER — POTASSIUM CHLORIDE CRYS ER 20 MEQ PO TBCR: EXTENDED_RELEASE_TABLET | ORAL | 0 refills | Status: AC

## 2018-10-01 MED ORDER — ACETAMINOPHEN 500 MG PO TABS
1000.0000 mg | ORAL_TABLET | Freq: Once | ORAL | Status: AC
  Administered 2018-10-01: 1000 mg via ORAL
  Filled 2018-10-01: qty 2

## 2018-10-01 NOTE — ED Notes (Addendum)
Pt back from MRI at this time

## 2018-10-01 NOTE — ED Notes (Signed)
Patient going to MRI at this time.

## 2018-10-01 NOTE — Consult Note (Signed)
Requesting Physician: Dr. Denton LankSteinl    Chief Complaint: Abnormal speech  History obtained from: Patient and Chart     HPI:                                                                                                                                       Melissa Yang is an 48 y.o. female with past medical history of hypertension presents to the ED as a code stroke for abnormal speech and right-sided weakness.  Patient was having an argument with her husband around 6:30 AM and she suddenly started having difficulty getting her words out.  EMS was called and on assessment patient was noted to have difficulty with her speech and right-sided weakness.  [facility].  On assessment patient speech appear to be more stuttering, rather than difficulty getting words out.  Also complained of left-sided headache.  She had mild right-sided weakness.  NIH stroke scale was 2.  Stat CT head obtained with no acute abnormality.  TPA was not administered her symptoms were mild and unclear if this was a stroke.     Past Medical History:  Diagnosis Date  . Anemia   . Hypertension   . Otitis media     Past Surgical History:  Procedure Laterality Date  . ABDOMINAL HYSTERECTOMY      Family History  Problem Relation Age of Onset  . Emphysema Mother        smoked  . Heart disease Mother   . Hypertension Mother   . Diabetes Mother   . Heart failure Mother   . Heart disease Father   . Diabetes Father   . Hypertension Father   . Breast cancer Maternal Grandmother   . Pancreatic cancer Paternal Grandmother    Social History:  reports that she has never smoked. She has never used smokeless tobacco. She reports that she does not drink alcohol or use drugs.  Allergies:  Allergies  Allergen Reactions  . Azithromycin     Medications:                                                                                                                        I reviewed home  medications   ROS:  14 systems reviewed and negative except above    Examination:                                                                                                      General: Appears well-developed  Psych: Affect appropriate to situation Eyes: No scleral injection HENT: No OP obstrucion Head: Normocephalic.  Cardiovascular: Normal rate and regular rhythm.  Respiratory: Effort normal and breath sounds normal to anterior ascultation GI: Soft.  No distension. There is no tenderness.  Skin: WDI    Neurological Examination Mental Status: Alert, oriented, thought content appropriate.  Speech fluent without evidence of aphasia.  Is able to name objects repeat sentences without any difficulty.  Able to read without any difficulty.  Stutters on occasion.  Able to follow 3 step commands without difficulty. Cranial Nerves: II: Visual fields grossly normal,  III,IV, VI: ptosis not present, extra-ocular motions intact bilaterally, pupils equal, round, reactive to light and accommodation V,VII: smile symmetric, facial light touch sensation normal bilaterally VIII: hearing normal bilaterally IX,X: uvula rises symmetrically XI: bilateral shoulder shrug XII: midline tongue extension Motor: Right : Upper extremity   4+/5 . No pronator drift    Left:     Upper extremity   5/5  Lower extremity   4+/5     Lower extremity   5/5 Tone and bulk:normal tone throughout; no atrophy noted Sensory: Pinprick and light touch intact throughout, bilaterally Deep Tendon Reflexes: 2+ and symmetric throughout Plantars: Right: downgoing   Left: downgoing Cerebellar: normal finger-to-nose, normal rapid alternating movements and normal heel-to-shin test Gait: normal gait and station     Lab Results: Basic Metabolic Panel: Recent Labs  Lab 10/01/18 0843  10/01/18 0900  NA 143  --   K 2.8*  --   CL 107  --   CO2 26  --   GLUCOSE 94  --   BUN 12  --   CREATININE 1.09* 1.00  CALCIUM 8.8*  --     CBC: Recent Labs  Lab 10/01/18 0843  WBC 3.4*  NEUTROABS 1.7  HGB 11.0*  HCT 35.5*  MCV 95.4  PLT 274    Coagulation Studies: Recent Labs    10/01/18 0843  LABPROT 13.7  INR 1.1    Imaging: Ct Head Code Stroke Wo Contrast`  Result Date: 10/01/2018 CLINICAL DATA:  Code stroke.  Stroke suspected EXAM: CT HEAD WITHOUT CONTRAST TECHNIQUE: Contiguous axial images were obtained from the base of the skull through the vertex without intravenous contrast. COMPARISON:  None. FINDINGS: Brain: No evidence of acute infarction, hemorrhage, hydrocephalus, extra-axial collection or mass lesion/mass effect. Vascular: No hyperdense vessel or unexpected calcification. Skull: Negative Sinuses/Orbits: Minimal left mastoid opacification. Other: Microcalcifications in the bilateral parotid. ASPECTS [facility] Stroke Program Early CT Score) Not scored with this history IMPRESSION: Motion degraded head CT without acute finding. Electronically Signed   By: Marnee Spring M.D.   On: 10/01/2018 07:49     ASSESSMENT AND PLAN  48 y.o. female with past medical history of hypertension presents to the ED as a code stroke for  abnormal speech and right-sided weakness.    Right-sided weakness, headache and difficulty with speech D/D complicated migraine versus vs somatoform disorder versus TIA less likely  Recommendations Stat MRI brain-obtain showed no acute stroke.    [facility] Pager Number [phone]

## 2018-10-01 NOTE — Discharge Instructions (Signed)
It was our pleasure to provide your ER care today - we hope that you feel better.  Your scans show no stroke. 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  For today's symptoms, as well as for the pineal cyst, follow up with neurologist in the next few weeks - call office to arrange follow up appointment.   Your lab tests show your potassium level is low (2.8) - eat plenty of fruits and vegetables, take potassium as prescribed, and follow up with primary care doctor in the coming week for recheck.   Return to ER if worse, new symptoms, change in speech or vision, new numbness/weakness, other concern.

## 2018-10-01 NOTE — ED Triage Notes (Signed)
Pt c/o right side right side weakness,  Left sided headache and non verbal that stared at 630 am while she was having a argument with her husband; pt alert and oriented x4  And following simple commands

## 2018-10-01 NOTE — Code Documentation (Signed)
CODE STROKE   Patient presented with [facility] with c/o Rt sided weakness and being non-verbal.  Per EMS patient had verbal argument with spouse prior to having symptoms.  Endorsed Lt sided headache, now verbal.  Blood sugar checked, normal. Taken straight to CT, CT head negative for bleed. NIH of 3.  Taken back to room.    Plan:  MRI head Will continue Q15 VS and Q30 Neuro checks until 11 am

## 2018-10-01 NOTE — ED Provider Notes (Signed)
[facility] EMERGENCY DEPARTMENT Provider Note   CSN: [id number] Arrival date & time: 10/01/18  0034  An emergency department physician performed an initial assessment on this suspected stroke patient at 0732.  History   Chief Complaint Chief Complaint  Patient presents with  . Code Stroke    HPI Melissa Yang is a 48 y.o. female.     Patient presents via EMS as 'code stroke'. EMS indicates patient was having argument with spouse, when she suddenly would not speak, ems was called. Symptoms acute onset, moderate, constant, persistent. On their arrival pt also noted right arm weakness. In route to ED pt began speaking. Pt currently denies numbness/weakness. No change in vision. No headache. No neck pain. Denies fever or chills. pts speech currently is halting/inconsistent in nature, without consistent dysarthria or expressive aphasia.   The history is provided by the patient and the EMS personnel.    Past Medical History:  Diagnosis Date  . Anemia   . Hypertension   . Otitis media     Patient Active Problem List   Diagnosis Date Noted  . Chest pain, unspecified 03/05/2014  . Essential hypertension, benign 03/05/2014    Past Surgical History:  Procedure Laterality Date  . ABDOMINAL HYSTERECTOMY       OB History   No obstetric history on file.      Home Medications    Prior to Admission medications   Medication Sig Start Date End Date Taking? Authorizing Provider  albuterol (VENTOLIN HFA) 108 (90 BASE) MCG/ACT inhaler Inhale 2 puffs into the lungs every 6 (six) hours as needed for wheezing or shortness of breath.    [provider]  ALPRAZolam Prudy Feeler) 1 MG tablet Take 1 mg by mouth daily as needed for anxiety.    [provider]  amLODipine (NORVASC) 5 MG tablet Take 5 mg by mouth 2 (two) times daily.    [provider]  fluconazole (DIFLUCAN) 150 MG tablet Take 1 tablet as needed for vaginal yeast infection.  May  repeat in 3 days if symptoms persist. Patient not taking: Reported on 06/18/2018 05/17/18   Molpus, Jonny Ruiz, MD  fluticasone (FLONASE) 50 MCG/ACT nasal spray Place 2 sprays into both nostrils daily as needed for allergies. 08/12/14   [provider]  HYDROcodone-acetaminophen (NORCO) 5-325 MG tablet Take 1 tablet by mouth every 6 (six) hours as needed (for pain). Patient not taking: Reported on 06/18/2018 05/17/18   Molpus, Jonny Ruiz, MD  Ibuprofen-diphenhydrAMINE Cit (IBUPROFEN PM PO) Take 2 tablets by mouth at bedtime.    [provider]  Multiple Vitamin (MULTIVITAMIN) tablet Take 1 tablet by mouth daily.    [provider]    Family History Family History  Problem Relation Age of Onset  . Emphysema Mother        smoked  . Heart disease Mother   . Hypertension Mother   . Diabetes Mother   . Heart failure Mother   . Heart disease Father   . Diabetes Father   . Hypertension Father   . Breast cancer Maternal Grandmother   . Pancreatic cancer Paternal Grandmother     Social History Social History   Tobacco Use  . Smoking status: Never Smoker  . Smokeless tobacco: Never Used  Substance Use Topics  . Alcohol use: No  . Drug use: No     Allergies   Azithromycin   Review of Systems Review of Systems  Constitutional: Negative for chills and fever.  HENT: Negative for  trouble swallowing.   Eyes: Negative for pain and visual disturbance.  Respiratory: Negative for cough and shortness of breath.   Cardiovascular: Negative for chest pain.  Gastrointestinal: Negative for abdominal pain, nausea and vomiting.  Endocrine: Negative for polyuria.  Genitourinary: Negative for flank pain.  Musculoskeletal: Negative for back pain and neck pain.  Skin: Negative for rash.  Neurological: Positive for speech difficulty. Negative for weakness and numbness.  Hematological: Does not bruise/bleed easily.  Psychiatric/Behavioral: Negative for confusion.     Physical  Exam Updated Vital Signs BP (!) 136/115   Pulse 78   Temp 97.8 F (36.6 C) (Oral)   Resp 18   Wt 87.3 kg   SpO2 98%   BMI 31.06 kg/m   Physical Exam Vitals signs and nursing note reviewed.  Constitutional:      Appearance: Normal appearance. She is well-developed.  HENT:     Head: Atraumatic.     Nose: Nose normal.     Mouth/Throat:     Mouth: Mucous membranes are moist.  Eyes:     General: No scleral icterus.    Extraocular Movements: Extraocular movements intact.     Conjunctiva/sclera: Conjunctivae normal.     Pupils: Pupils are equal, round, and reactive to light.  Neck:     Musculoskeletal: Normal range of motion and neck supple. No neck rigidity or muscular tenderness.     Vascular: No carotid bruit.     Trachea: No tracheal deviation.  Cardiovascular:     Rate and Rhythm: Normal rate and regular rhythm.     Pulses: Normal pulses.     Heart sounds: Normal heart sounds. No murmur. No friction rub. No gallop.   Pulmonary:     Effort: Pulmonary effort is normal. No respiratory distress.     Breath sounds: Normal breath sounds.  Abdominal:     General: Bowel sounds are normal. There is no distension.     Palpations: Abdomen is soft.     Tenderness: There is no abdominal tenderness. There is no guarding.  Genitourinary:    Comments: No cva tenderness.  Musculoskeletal:        General: No swelling.     Right lower leg: No edema.     Left lower leg: No edema.  Skin:    General: Skin is warm and dry.     Findings: No rash.  Neurological:     Mental Status: She is alert.     Comments: Alert, speech of halting/inconsistent quality, no consistent dysarthria or expressive aphasia. Motor intact bil, stre 5/5. sens grossly intact bil. No pronator drift.     Psychiatric:        Mood and Affect: Mood normal.      ED Treatments / Results  Labs (all labs ordered are listed, but only abnormal results are displayed) Results for orders placed or performed during the  hospital encounter of 10/01/18  CBC  Result Value Ref Range   WBC 3.4 (L) 4.0 - 10.5 K/uL   RBC 3.72 (L) 3.87 - 5.11 MIL/uL   Hemoglobin 11.0 (L) 12.0 - 15.0 g/dL   HCT 96.0 (L) 45.4 - 09.8 %   MCV 95.4 80.0 - 100.0 fL   MCH 29.6 26.0 - 34.0 pg   MCHC 31.0 30.0 - 36.0 g/dL   RDW 11.9 14.7 - 82.9 %   Platelets 274 150 - 400 K/uL   nRBC 0.0 0.0 - 0.2 %  Differential  Result Value Ref Range   Neutrophils Relative %  50 %   Neutro Abs 1.7 1.7 - 7.7 K/uL   Lymphocytes Relative 38 %   Lymphs Abs 1.3 0.7 - 4.0 K/uL   Monocytes Relative 10 %   Monocytes Absolute 0.3 0.1 - 1.0 K/uL   Eosinophils Relative 2 %   Eosinophils Absolute 0.1 0.0 - 0.5 K/uL   Basophils Relative 0 %   Basophils Absolute 0.0 0.0 - 0.1 K/uL   Immature Granulocytes 0 %   Abs Immature Granulocytes 0.00 0.00 - 0.07 K/uL  I-stat Creatinine, ED  Result Value Ref Range   Creatinine, Ser 1.00 0.44 - 1.00 mg/dL  I-Stat beta hCG blood, ED  Result Value Ref Range   I-stat hCG, quantitative <5.0 <5 mIU/mL   Comment 3            EKG EKG Interpretation  Date/Time:  Tuesday October 01 2018 08:47:56 EDT Ventricular Rate:  80 PR Interval:    QRS Duration: 91 QT Interval:  386 QTC Calculation: 446 R Axis:   30 Text Interpretation:  Sinus rhythm No significant change since last tracing Confirmed by Cathren LaineSteinl, Ayven Glasco ([id number]) on 10/01/2018 11:31:04 AM   Radiology Mr Brain Wo Contrast  Result Date: 10/01/2018 CLINICAL DATA:  Right-sided weakness.  Headache EXAM: MRI HEAD WITHOUT CONTRAST TECHNIQUE: Multiplanar, multiecho pulse sequences of the brain and surrounding structures were obtained without intravenous contrast. COMPARISON:  CT head 10/01/2018 FINDINGS: Brain: Ventricle size normal. Negative for acute or chronic infarction. Negative for hemorrhage mass or fluid collection. 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 Vascular: Normal arterial flow voids Skull and upper cervical spine: Negative Sinuses/Orbits:  Negative Other: None IMPRESSION: No acute intracranial abnormality. 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 Electronically Signed   By: Marlan Palauharles  Clark M.D.   On: 10/01/2018 13:12   Ct Head Code Stroke Wo Contrast`  Result Date: 10/01/2018 CLINICAL DATA:  Code stroke.  Stroke suspected EXAM: CT HEAD WITHOUT CONTRAST TECHNIQUE: Contiguous axial images were obtained from the base of the skull through the vertex without intravenous contrast. COMPARISON:  None. FINDINGS: Brain: No evidence of acute infarction, hemorrhage, hydrocephalus, extra-axial collection or mass lesion/mass effect. Vascular: No hyperdense vessel or unexpected calcification. Skull: Negative Sinuses/Orbits: Minimal left mastoid opacification. Other: Microcalcifications in the bilateral parotid. ASPECTS [facility]([place] Stroke Program Early CT Score) Not scored with this history IMPRESSION: Motion degraded head CT without acute finding. Electronically Signed   By: Marnee SpringJonathon  Watts M.D.   On: 10/01/2018 07:49   Ct Head Code Stroke Wo Contrast  Result Date: 10/01/2018 CLINICAL DATA:  Code stroke.  Stroke suspected EXAM: CT HEAD WITHOUT CONTRAST TECHNIQUE: Contiguous axial images were obtained from the base of the skull through the vertex without intravenous contrast. COMPARISON:  None. FINDINGS: Brain: No evidence of acute infarction, hemorrhage, hydrocephalus, extra-axial collection or mass lesion/mass effect. Vascular: No hyperdense vessel or unexpected calcification. Skull: Negative Sinuses/Orbits: Minimal left mastoid opacification. Other: Microcalcifications in the bilateral parotid. ASPECTS [facility]([place] Stroke Program Early CT Score) Not scored with this history IMPRESSION: Motion degraded head CT without acute finding. Electronically Signed   By: Marnee SpringJonathon  Watts M.D.   On: 10/01/2018 07:49    Procedures Procedures (including critical care time)  Medications Ordered in ED Medications -  No data to display   Initial Impression / Assessment and Plan / ED Course  I have reviewed the triage vital signs and the nursing notes.  Pertinent labs & imaging results that were available  during my care of the patient were reviewed by me and considered in my medical decision making (see chart for details).  Iv ns. Patient was code stroke activation prior to arrival.   Neurology evaluated patient at bridge and ordered imaging.   Stat labs sent.  Reviewed nursing notes and prior charts for additional history.   Labs reviewed - K low, 2.8. kcl po.  Acetaminophen po.   CT reviewed - no acute hem.   Neurology re-evaluated and is ordering MRI.  [facility]uerology has reviewed ct and mri (their note with pts initial wrong name/incorrect reg) - I spoke with neurologist by phone, Dr Laurence Slate, he indicates neg mri for cva, and to d/c to home, he indicates incidental note of pineal cyst can be followed up as outpt.     Final Clinical Impressions(s) / ED Diagnoses   Final diagnoses:  None    ED Discharge Orders    None       Cathren Laine, MD 10/01/18 1323

## 2018-10-01 NOTE — ED Provider Notes (Addendum)
[facility]   CSN: [id number] Arrival date & time: 10/01/18  0732    History   Chief Complaint Chief Complaint  Patient presents with  . Code Stroke    HPI Melissa Yang is a 48 y.o. female.     Patient presents via EMS as 'code stroke'. EMS indicates patient was having argument with spouse, when she suddenly would not speak, ems was called. Symptoms acute onset, moderate, constant, persistent. On their arrival pt also noted right arm weakness. In route to ED pt began speaking. Pt currently denies numbness/weakness. No change in vision. No headache. No neck pain. Denies fever or chills. pts speech currently is halting/inconsistent in nature, without consistent dysarthria or expressive aphasia.   The history is provided by the patient and the EMS personnel.    Past Medical History:  Diagnosis Date  . Diabetes mellitus     Patient Active Problem List   Diagnosis Date Noted  . IUD - placed 2010 pt thinks 02/22/2012    Past Surgical History:  Procedure Laterality Date  . Laser/ cervical cancer  2005  . TONSILECTOMY, ADENOIDECTOMY, BILATERAL MYRINGOTOMY AND TUBES  2011     OB History   No obstetric history on file.      Home Medications    Prior to Admission medications   Medication Sig Start Date End Date Taking? Authorizing Provider  cephALEXin (KEFLEX) 500 MG capsule Take 1 capsule (500 mg total) by mouth 4 (four) times daily. 08/06/14   Ladona MowMintz, Joe, PA-C  HYDROcodone-acetaminophen (NORCO/VICODIN) 5-325 MG per tablet Take 2 tablets by mouth every 4 (four) hours as needed for moderate pain or severe pain. 08/06/14   Ladona MowMintz, Joe, PA-C  metFORMIN (GLUCOPHAGE) 1000 MG tablet Take 1,000 mg by mouth 2 (two) times daily with a meal.    [provider]  phentermine 37.5 MG capsule Take 37.5 mg by mouth every morning.    [provider]  sulfamethoxazole-trimethoprim (BACTRIM DS,SEPTRA DS) 800-160 MG per  tablet Take 1 tablet by mouth 2 (two) times daily. 08/06/14   Ladona MowMintz, Joe, PA-C    Family History Family History  Problem Relation Age of Onset  . Heart disease Father   . Diabetes Father     Social History Social History   Tobacco Use  . Smoking status: Current Every Day Smoker    Packs/day: 0.50  Substance Use Topics  . Alcohol use: Yes  . Drug use: No     Allergies   Zithromax [azithromycin]   Review of Systems Review of Systems  Constitutional: Negative for fever.  HENT: Negative for trouble swallowing.   Eyes: Negative for pain and visual disturbance.  Respiratory: Negative for cough and shortness of breath.   Cardiovascular: Negative for chest pain.  Gastrointestinal: Negative for abdominal pain and vomiting.  Endocrine: Negative for polyuria.  Genitourinary: Negative for flank pain.  Musculoskeletal: Negative for back pain and neck pain.  Skin: Negative for rash.  Neurological: Positive for speech difficulty. Negative for weakness and numbness.  Hematological: Does not bruise/bleed easily.  Psychiatric/Behavioral: Negative for confusion.     Physical Exam Updated Vital Signs BP (!) 159/100   Pulse 85   Temp 97.8 F (36.6 C) (Oral)   Resp 18   Ht 1.676 m (5\' 6" )   Wt 87.3 kg   SpO2 99%   BMI 31.06 kg/m   Physical Exam Vitals signs and nursing note reviewed.  Constitutional:      Appearance: Normal  appearance. She is well-developed.  HENT:     Head: Atraumatic.     Nose: Nose normal.     Mouth/Throat:     Mouth: Mucous membranes are moist.  Eyes:     General: No scleral icterus.    Extraocular Movements: Extraocular movements intact.     Conjunctiva/sclera: Conjunctivae normal.     Pupils: Pupils are equal, round, and reactive to light.  Neck:     Musculoskeletal: Normal range of motion and neck supple. No neck rigidity or muscular tenderness.     Trachea: No tracheal deviation.  Cardiovascular:     Rate and Rhythm: Normal rate and regular  rhythm.     Pulses: Normal pulses.     Heart sounds: Normal heart sounds. No murmur. No friction rub. No gallop.   Pulmonary:     Effort: Pulmonary effort is normal. No respiratory distress.     Breath sounds: Normal breath sounds.  Abdominal:     General: Bowel sounds are normal. There is no distension.     Palpations: Abdomen is soft.     Tenderness: There is no abdominal tenderness. There is no guarding.  Genitourinary:    Comments: No cva tenderness.  Musculoskeletal:        General: No swelling.     Right lower leg: No edema.     Left lower leg: No edema.  Skin:    General: Skin is warm and dry.     Findings: No rash.  Neurological:     General: No focal deficit present.     Mental Status: She is alert and oriented to person, place, and time.     Comments: Alert, speech halting/inconsistent in quality without dysarthria. Motor intact bil, stre 5/5. sens grossly intact bil. No pronator drift.   Psychiatric:        Mood and Affect: Mood normal.      ED Treatments / Results  Labs (all labs ordered are listed, but only abnormal results are displayed) Results for orders placed or performed during the hospital encounter of 10/01/18  Protime-INR  Result Value Ref Range   Prothrombin Time 13.4 11.4 - 15.2 seconds   INR 1.0 0.8 - 1.2  APTT  Result Value Ref Range   aPTT 28 24 - 36 seconds  CBC  Result Value Ref Range   WBC 3.7 (L) 4.0 - 10.5 K/uL   RBC 3.93 3.87 - 5.11 MIL/uL   Hemoglobin 11.5 (L) 12.0 - 15.0 g/dL   HCT 65.7 84.6 - 96.2 %   MCV 94.4 80.0 - 100.0 fL   MCH 29.3 26.0 - 34.0 pg   MCHC 31.0 30.0 - 36.0 g/dL   RDW 95.2 84.1 - 32.4 %   Platelets 297 150 - 400 K/uL   nRBC 0.0 0.0 - 0.2 %  Differential  Result Value Ref Range   Neutrophils Relative % 43 %   Neutro Abs 1.6 (L) 1.7 - 7.7 K/uL   Lymphocytes Relative 39 %   Lymphs Abs 1.4 0.7 - 4.0 K/uL   Monocytes Relative 13 %   Monocytes Absolute 0.5 0.1 - 1.0 K/uL   Eosinophils Relative 3 %    Eosinophils Absolute 0.1 0.0 - 0.5 K/uL   Basophils Relative 1 %   Basophils Absolute 0.0 0.0 - 0.1 K/uL   Immature Granulocytes 1 %   Abs Immature Granulocytes 0.02 0.00 - 0.07 K/uL  I-stat Creatinine, ED  Result Value Ref Range   Creatinine, Ser 1.20 (H) 0.44 -  1.00 mg/dL  CBG monitoring, ED  Result Value Ref Range   Glucose-Capillary 84 70 - 99 mg/dL   Comment 1 Notify RN    Comment 2 Document in Chart   I-Stat beta hCG blood, ED  Result Value Ref Range   I-stat hCG, quantitative <5.0 <5 mIU/mL   Comment 3           Ct Head Code Stroke Wo Contrast  Result Date: 10/01/2018 CLINICAL DATA:  Code stroke.  Stroke suspected EXAM: CT HEAD WITHOUT CONTRAST TECHNIQUE: Contiguous axial images were obtained from the base of the skull through the vertex without intravenous contrast. COMPARISON:  None. FINDINGS: Brain: No evidence of acute infarction, hemorrhage, hydrocephalus, extra-axial collection or mass lesion/mass effect. Vascular: No hyperdense vessel or unexpected calcification. Skull: Negative Sinuses/Orbits: Minimal left mastoid opacification. Other: Microcalcifications in the bilateral parotid. ASPECTS [facility] Stroke Program Early CT Score) Not scored with this history IMPRESSION: Motion degraded head CT without acute finding. Electronically Signed   By: Marnee Spring M.D.   On: 10/01/2018 07:49    EKG None  Radiology Ct Head Code Stroke Wo Contrast  Result Date: 10/01/2018 CLINICAL DATA:  Code stroke.  Stroke suspected EXAM: CT HEAD WITHOUT CONTRAST TECHNIQUE: Contiguous axial images were obtained from the base of the skull through the vertex without intravenous contrast. COMPARISON:  None. FINDINGS: Brain: No evidence of acute infarction, hemorrhage, hydrocephalus, extra-axial collection or mass lesion/mass effect. Vascular: No hyperdense vessel or unexpected calcification. Skull: Negative Sinuses/Orbits: Minimal left mastoid opacification. Other: Microcalcifications in the bilateral  parotid. ASPECTS [facility] Stroke Program Early CT Score) Not scored with this history IMPRESSION: Motion degraded head CT without acute finding. Electronically Signed   By: Marnee Spring M.D.   On: 10/01/2018 07:49    Procedures Procedures (including critical care time)  Medications Ordered in ED Medications  sodium chloride flush (NS) 0.9 % injection 3 mL (has no administration in time range)     Initial Impression / Assessment and Plan / ED Course  I have reviewed the triage vital signs and the nursing notes.  Pertinent labs & imaging results that were available during my care of the patient were reviewed by me and considered in my medical decision making (see chart for details).  Iv ns. Patient was code stroke activation prior to arrival.   Neurology evaluated patient at bridge and ordered imaging.   Stat labs sent.  Reviewed nursing notes and prior charts for additional history.   Labs reviewed - chem normal.   CT reviewed - no acute hem.   Neurology re-evaluated and is ordering MRI.  Registration has entirely re-done patients chart as they indicate this patients true name/demographics is entirely different....as such, documentation will be repeated in patients new chart.     Final Clinical Impressions(s) / ED Diagnoses   Final diagnoses:  None    ED Discharge Orders    None       Cathren Laine, MD 10/01/18 6811    Cathren Laine, MD 10/18/18 1651

## 2018-10-01 NOTE — Consult Note (Addendum)
Requesting Physician: Dr. Denton Lank    Chief Complaint: Stuttering speech, right side weakness  History obtained from: Patient and Chart     HPI:                                                                                                                                       Melissa Yang is an 48 y.o. female with past medical have history of hypertension presented to the EMS after having an argument with her spouse at 6:30 AM and suddenly could not speak.  He was also noticed patient had right sided weakness and activated as a code stroke.  On arrival, patient noted to have some stuttering speech.  Right arm and right leg appeared to be weak however improved with encouragement.  Patient had a stat CT head which is negative for acute findings.  tPA was not administered as symptoms were mild and nondisabling and low suspicion for stroke.     Past Medical History:  Diagnosis Date  . Anemia   . Hypertension   . Otitis media     Past Surgical History:  Procedure Laterality Date  . ABDOMINAL HYSTERECTOMY      Family History  Problem Relation Age of Onset  . Emphysema Mother        smoked  . Heart disease Mother   . Hypertension Mother   . Diabetes Mother   . Heart failure Mother   . Heart disease Father   . Diabetes Father   . Hypertension Father   . Breast cancer Maternal Grandmother   . Pancreatic cancer Paternal Grandmother    Social History:  reports that she has never smoked. She has never used smokeless tobacco. She reports that she does not drink alcohol or use drugs.  Allergies:  Allergies  Allergen Reactions  . Azithromycin     Medications:                                                                                                                        I reviewed home medications  ROS:  14 systems reviewed and negative  except above   Examination:                                                                                                      General: Appears well-developed Psych: Affect appropriate to situation Eyes: No scleral injection HENT: No OP obstrucion Head: Normocephalic.  Cardiovascular: Normal rate and regular rhythm.  Respiratory: Effort normal and breath sounds normal to anterior ascultation GI: Soft.  No distension. There is no tenderness.  Skin: WDI    Neurological Examination Mental Status: Alert, oriented, thought content appropriate.  Speech fluent without evidence of aphasia, initially stuttering however over time improved.  Able to follow 3 step commands without difficulty. Cranial Nerves: II: Visual fields grossly normal,  III,IV, VI: ptosis not present, extra-ocular motions intact bilaterally, pupils equal, round, reactive to light and accommodation V,VII: smile symmetric, facial light touch sensation normal bilaterally VIII: hearing normal bilaterally IX,X: uvula rises symmetrically XI: bilateral shoulder shrug XII: midline tongue extension Motor: Right : Upper extremity   4+/5    Left:     Upper extremity   5/5  Lower extremity   4+5, Hoover's positive    lower extremity   5/5 Tone and bulk:normal tone throughout; no atrophy noted Sensory: Pinprick and light touch intact throughout, bilaterally Deep Tendon Reflexes: 2+ and symmetric throughout Plantars: Right: downgoing   Left: downgoing Cerebellar: normal finger-to-nose, normal rapid alternating movements and normal heel-to-shin test Gait: normal gait and station     Lab Results: Basic Metabolic Panel: Recent Labs  Lab 10/01/18 0733 10/01/18 0739 10/01/18 0843 10/01/18 0900  NA 142  --  143  --   K 2.8*  --  2.8*  --   CL 107  --  107  --   CO2 24  --  26  --   GLUCOSE 93  --  94  --   BUN 13  --  12  --   CREATININE 1.27* 1.20* 1.09* 1.00  CALCIUM 8.9  --  8.8*  --     CBC: Recent Labs  Lab  10/01/18 0733 10/01/18 0843  WBC 3.7* 3.4*  NEUTROABS 1.6* 1.7  HGB 11.5* 11.0*  HCT 37.1 35.5*  MCV 94.4 95.4  PLT 297 274    Coagulation Studies: Recent Labs    10/01/18 0733 10/01/18 0843  LABPROT 13.4 13.7  INR 1.0 1.1    Imaging: Mr Brain Wo Contrast  Result Date: 10/01/2018 CLINICAL DATA:  Right-sided weakness.  Headache EXAM: MRI HEAD WITHOUT CONTRAST TECHNIQUE: Multiplanar, multiecho pulse sequences of the brain and surrounding structures were obtained without intravenous contrast. COMPARISON:  CT head 10/01/2018 FINDINGS: Brain: Ventricle size normal. Negative for acute or chronic infarction. Negative for hemorrhage mass or fluid collection. 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 Vascular: Normal arterial flow voids Skull and upper cervical spine: Negative Sinuses/Orbits: Negative Other: None IMPRESSION: No acute intracranial abnormality. 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 Electronically Signed   By: Marlan Palauharles  Clark M.D.  On: 10/01/2018 13:12   Ct Head Code Stroke Wo Contrast`  Result Date: 10/01/2018 CLINICAL DATA:  Code stroke.  Stroke suspected EXAM: CT HEAD WITHOUT CONTRAST TECHNIQUE: Contiguous axial images were obtained from the base of the skull through the vertex without intravenous contrast. COMPARISON:  None. FINDINGS: Brain: No evidence of acute infarction, hemorrhage, hydrocephalus, extra-axial collection or mass lesion/mass effect. Vascular: No hyperdense vessel or unexpected calcification. Skull: Negative Sinuses/Orbits: Minimal left mastoid opacification. Other: Microcalcifications in the bilateral parotid. ASPECTS [facility] Stroke Program Early CT Score) Not scored with this history IMPRESSION: Motion degraded head CT without acute finding. Electronically Signed   By: Marnee Spring M.D.   On: 10/01/2018 07:49   Ct Head Code Stroke Wo Contrast  Result Date: 10/01/2018 CLINICAL  DATA:  Code stroke.  Stroke suspected EXAM: CT HEAD WITHOUT CONTRAST TECHNIQUE: Contiguous axial images were obtained from the base of the skull through the vertex without intravenous contrast. COMPARISON:  None. FINDINGS: Brain: No evidence of acute infarction, hemorrhage, hydrocephalus, extra-axial collection or mass lesion/mass effect. Vascular: No hyperdense vessel or unexpected calcification. Skull: Negative Sinuses/Orbits: Minimal left mastoid opacification. Other: Microcalcifications in the bilateral parotid. ASPECTS [facility] Stroke Program Early CT Score) Not scored with this history IMPRESSION: Motion degraded head CT without acute finding. Electronically Signed   By: Marnee Spring M.D.   On: 10/01/2018 07:49     ASSESSMENT AND PLAN  48 year old female with history of hypertension presented with abnormal speech and right-sided weakness.  On examination speech appear to be more stuttering, inconsistent on examination patient having no difficulty forming sentences, repetition as well as naming objects and reading.  Patient had right arm and right leg weakness which also improved after encouragement appeared to be effort related.  Patient did complain of a headache, denies history of migraine.   Stuttering speech and  right-sided weakness  Recommendations MRI negative for acute stroke.  Shows a large pineal cyst, as incidental ental finding. Reassured patient can follow-up with PCP will need repeat MRI imaging in 6 months.  If patient has recurrent episodes,  can follow-up with neurology as outpatient.     [facility] Pager Number [phone]

## 2018-10-01 NOTE — Code Documentation (Signed)
CODE STROKE   Patient presented with [facility] with c/o Rt sided weakness and being non-verbal.  Per EMS patient had verbal argument with spouse prior to having symptoms.  Endorsed Lt sided headache, now verbal.  Blood sugar checked, normal. Taken straight to CT, CT head negative for bleed. NIH of 3.  Taken back to room.    Plan:  MRI head Will continue Q15 VS and Q30 Neuro checks until 11 am 

## 2018-10-01 NOTE — ED Notes (Signed)
Per MD ,ok to discharge without urine specimen

## 2018-10-01 NOTE — ED Triage Notes (Signed)
Pt brought in by ems for c/o Right sided weakness, non verbal , and left sided headache that began approx around 0630 today while she was having a argument with husband, pt alert and oriented x 4 and following simple commands

## 2018-12-24 ENCOUNTER — Emergency Department (HOSPITAL_COMMUNITY)
Admission: EM | Admit: 2018-12-24 | Discharge: 2018-12-24 | Discharge status: Against Medical Advice | Payer: BLUE CROSS/BLUE SHIELD | Attending: Emergency Medicine | Admitting: Emergency Medicine

## 2018-12-24 ENCOUNTER — Encounter (HOSPITAL_COMMUNITY): Payer: Self-pay | Admitting: Emergency Medicine

## 2018-12-24 ENCOUNTER — Other Ambulatory Visit: Payer: Self-pay

## 2018-12-24 DIAGNOSIS — Z5321 Procedure and treatment not carried out due to patient leaving prior to being seen by health care provider: Secondary | ICD-10-CM | POA: Diagnosis not present

## 2018-12-24 DIAGNOSIS — R0789 Other chest pain: Secondary | ICD-10-CM | POA: Diagnosis present

## 2018-12-24 HISTORY — DX: Personal history of other endocrine, nutritional and metabolic disease: Z86.39

## 2018-12-24 HISTORY — DX: Headache, unspecified: R51.9

## 2018-12-24 LAB — I-STAT BETA HCG BLOOD, ED (MC, WL, AP ONLY): I-stat hCG, quantitative: <5 m[IU]/mL (ref <5)

## 2018-12-24 LAB — BASIC METABOLIC PANEL
Anion gap: 9 (ref 5–15)
BUN: 12 mg/dL (ref 6–20)
CO2: 27 mmol/L (ref 22–32)
Calcium: 9.3 mg/dL (ref 8.9–10.3)
Chloride: 104 mmol/L (ref 98–111)
Creatinine, Ser: 0.95 mg/dL (ref 0.44–1.00)
GFR calc Af Amer: >60 mL/min (ref >60)
GFR calc non Af Amer: >60 mL/min (ref >60)
Glucose, Bld: 109 mg/dL — ABNORMAL HIGH (ref 70–99)
Potassium: 2.8 mmol/L — ABNORMAL LOW (ref 3.5–5.1)
Sodium: 140 mmol/L (ref 135–145)

## 2018-12-24 LAB — CBC
HCT: 36.2 % (ref 36.0–46.0)
Hemoglobin: 11.8 g/dL — ABNORMAL LOW (ref 12.0–15.0)
MCH: 30.3 pg (ref 26.0–34.0)
MCHC: 32.6 g/dL (ref 30.0–36.0)
MCV: 92.8 fL (ref 80.0–100.0)
Platelets: 291 10*3/uL (ref 150–400)
RBC: 3.9 MIL/uL (ref 3.87–5.11)
RDW: 13.7 % (ref 11.5–15.5)
WBC: 4.2 10*3/uL (ref 4.0–10.5)
nRBC: 0 % (ref 0.0–0.2)

## 2018-12-24 LAB — TROPONIN I (HIGH SENSITIVITY): Troponin I (High Sensitivity): 5 ng/L (ref <18)

## 2018-12-24 MED ORDER — SODIUM CHLORIDE 0.9% FLUSH: 3.0000 mL | Freq: Once | INTRAVENOUS | Status: DC

## 2018-12-24 NOTE — ED Notes (Signed)
Pt left after triage. Registration saw pt walking out

## 2018-12-24 NOTE — ED Triage Notes (Signed)
Pt complains of CP and SOB that started this morning. Pain is non radiating. Denies n/v/d

## 2021-04-01 ENCOUNTER — Ambulatory Visit (INDEPENDENT_AMBULATORY_CARE_PROVIDER_SITE_OTHER): Payer: Self-pay

## 2021-04-01 ENCOUNTER — Other Ambulatory Visit: Payer: Self-pay | Admitting: Physician Assistant

## 2021-04-01 ENCOUNTER — Other Ambulatory Visit: Payer: Self-pay

## 2021-04-01 DIAGNOSIS — W19XXXA Unspecified fall, initial encounter: Secondary | ICD-10-CM

## 2021-04-01 DIAGNOSIS — M25522 Pain in left elbow: Secondary | ICD-10-CM

## 2021-04-01 DIAGNOSIS — M25532 Pain in left wrist: Secondary | ICD-10-CM

## 2021-04-01 DIAGNOSIS — M25562 Pain in left knee: Secondary | ICD-10-CM

## 2021-04-06 ENCOUNTER — Other Ambulatory Visit: Payer: Self-pay | Admitting: Physician Assistant

## 2021-04-06 ENCOUNTER — Other Ambulatory Visit: Payer: Self-pay

## 2021-04-06 ENCOUNTER — Ambulatory Visit (INDEPENDENT_AMBULATORY_CARE_PROVIDER_SITE_OTHER): Payer: Self-pay

## 2021-04-06 DIAGNOSIS — R52 Pain, unspecified: Secondary | ICD-10-CM

## 2021-04-06 DIAGNOSIS — M25552 Pain in left hip: Secondary | ICD-10-CM

## 2021-04-06 DIAGNOSIS — M542 Cervicalgia: Secondary | ICD-10-CM

## 2021-04-06 DIAGNOSIS — W19XXXA Unspecified fall, initial encounter: Secondary | ICD-10-CM

## 2022-07-15 IMAGING — DX DG CERVICAL SPINE COMPLETE 4+V
6 series · 6 of 6 positions shown · non-contrast
Comparison: None.

CLINICAL DATA: Fell and injured neck.

EXAM:
CERVICAL SPINE - COMPLETE 4+ VIEW

[c-spine lat]
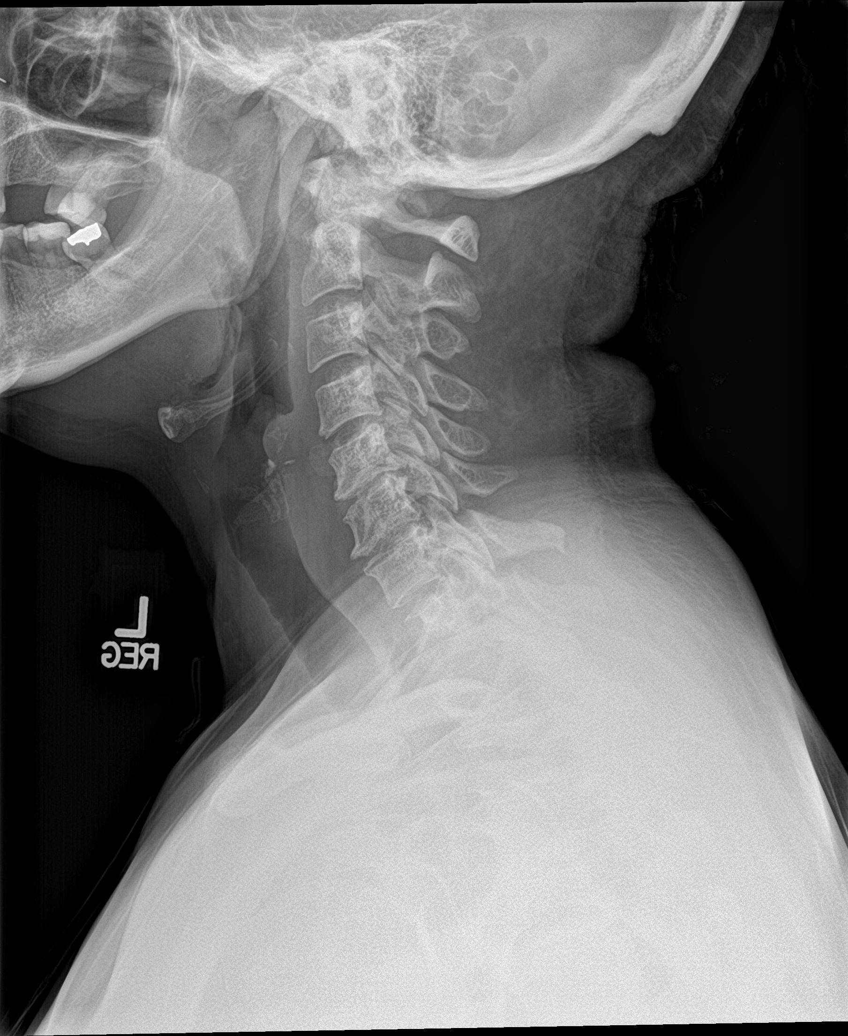

[c-spine obl (1 of 2)]
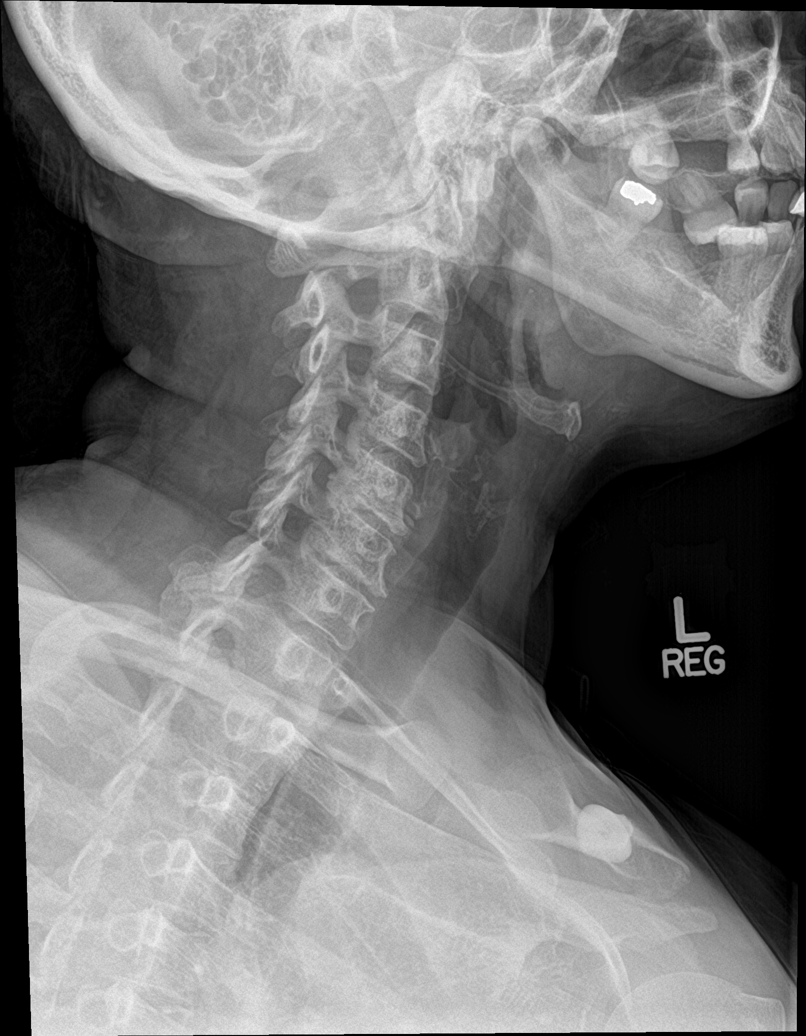

[c-spine obl (2 of 2)]
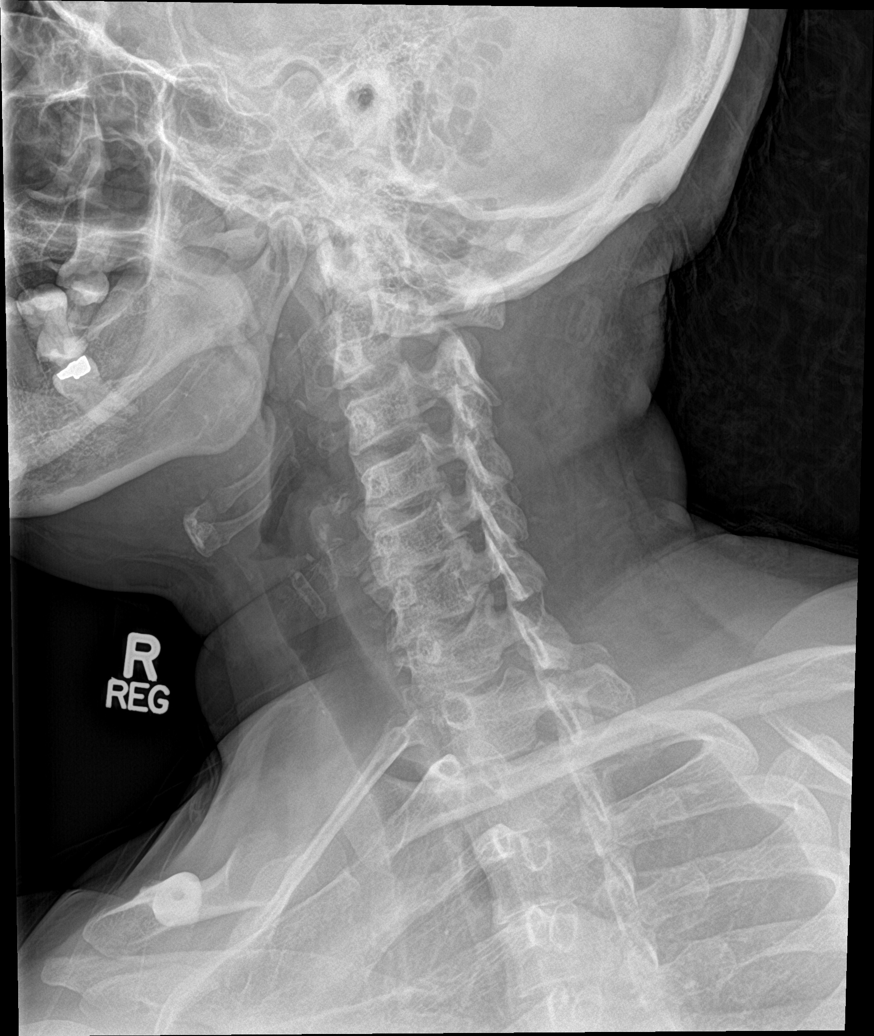

[c-spine ap]
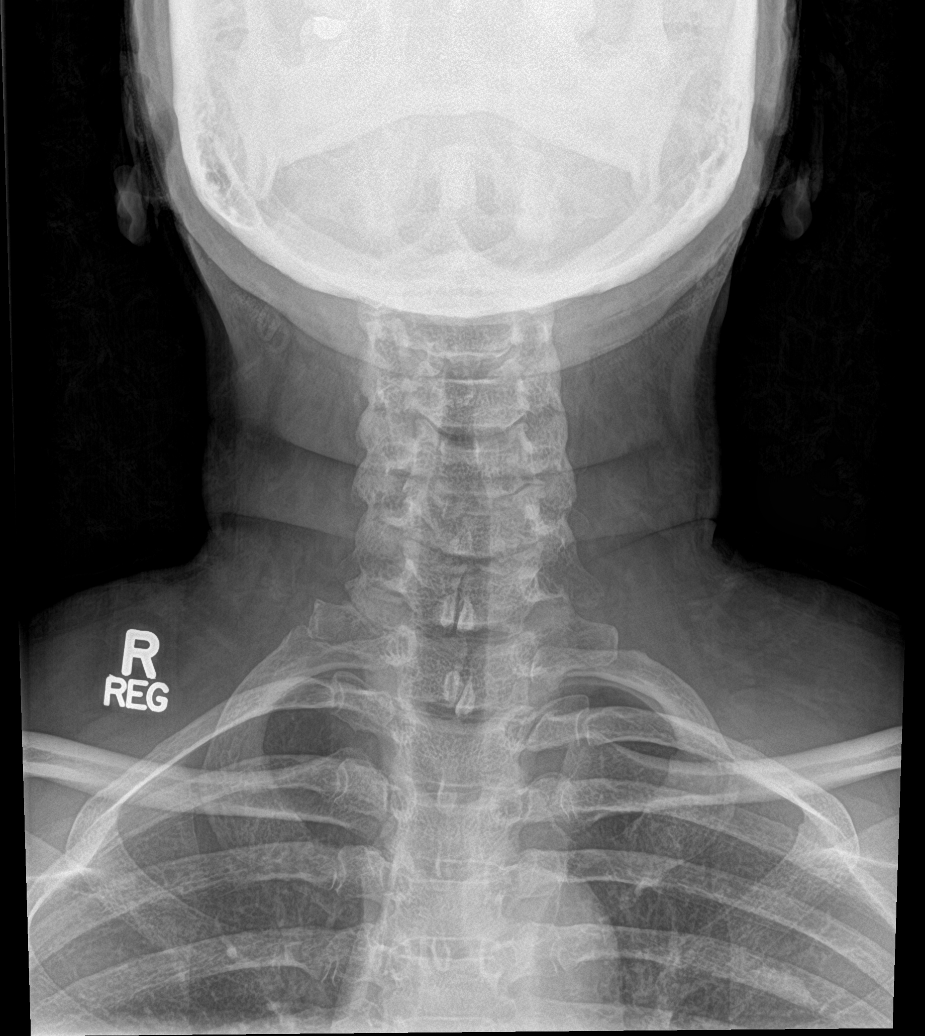

[c-spine open mouth]
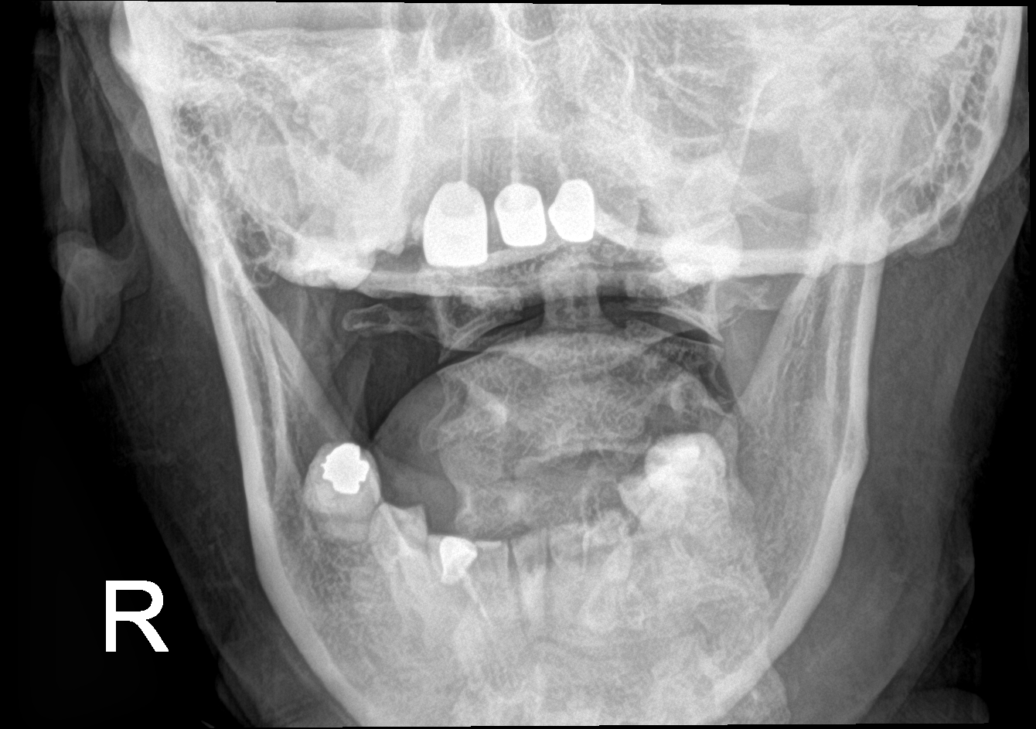

[c-spine swimmers]
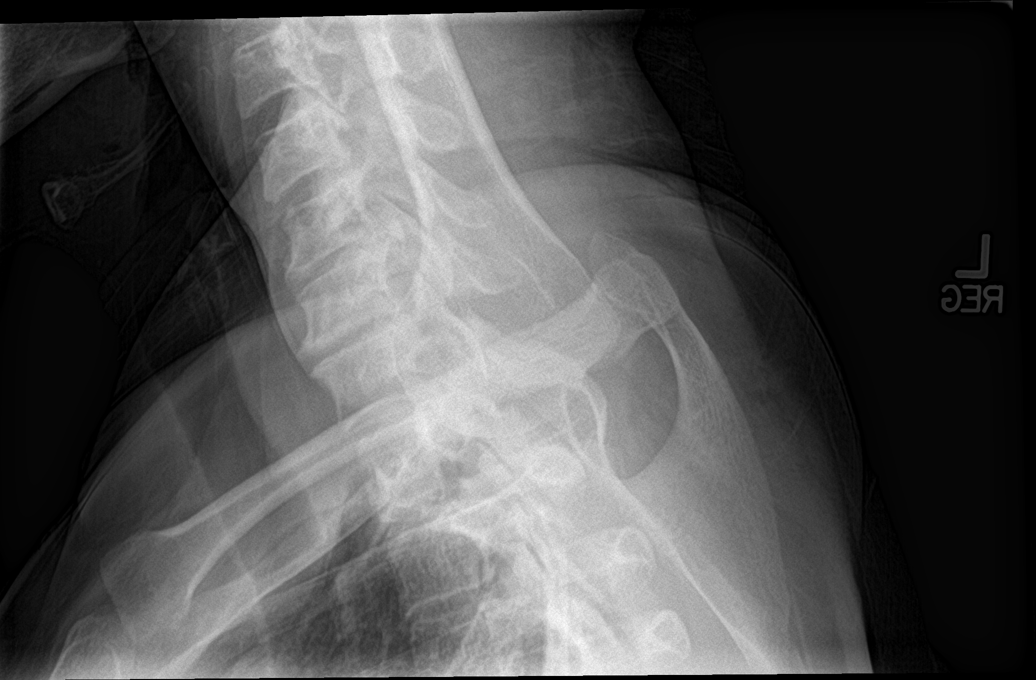

[6 of 6 positions shown; findings below may reference images not displayed]

FINDINGS: Normal alignment of the cervical vertebral bodies. No acute cervical
spine fracture. No abnormal prevertebral soft tissue swelling.
Moderate age advanced degenerative cervical spondylosis with disc
disease and facet disease most significant at C5-6 and C6-7.

The oblique films demonstrate mild bony foraminal narrowing at C5-6
and C6-7 due to mild uncinate spurring changes. The facets are
normally aligned.

The C1-2 articulations are maintained. The lung apices are clear.
IMPRESSION: 1. Degenerative cervical spondylosis with disc disease and facet
disease most significant at C5-6 and C6-7.
2. No acute bony findings.

## 2022-07-15 IMAGING — DX DG HIP (WITH OR WITHOUT PELVIS) 2-3V*L*
3 series · 3 of 3 positions shown · non-contrast
Comparison: None.

CLINICAL DATA: Fell. Left hip pain.

EXAM:
DG HIP (WITH OR WITHOUT PELVIS) 2-3V LEFT

[pelvis ap]
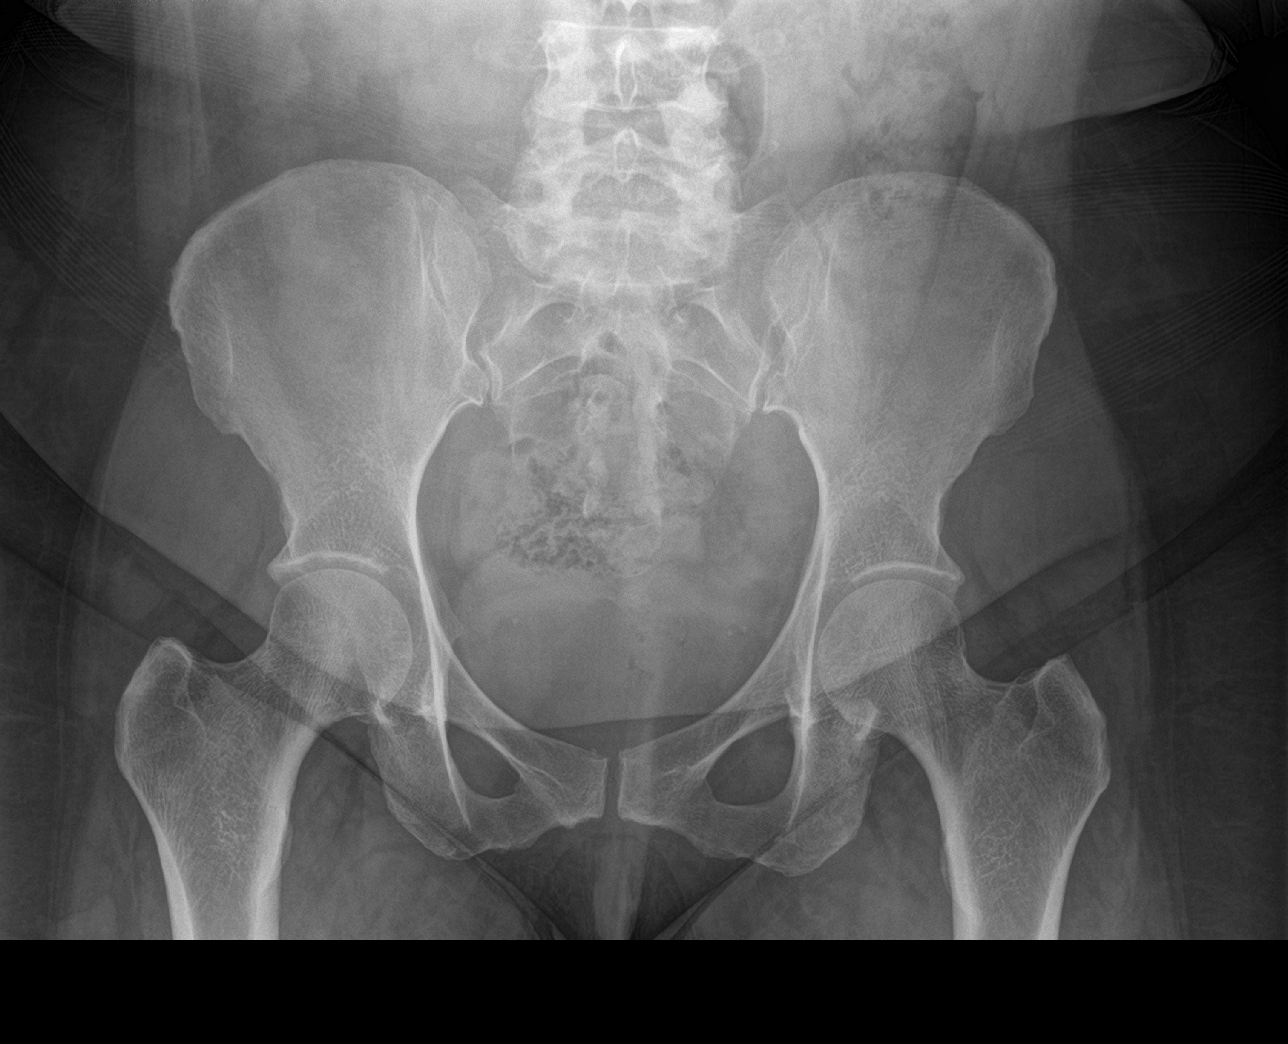

[hip ap]
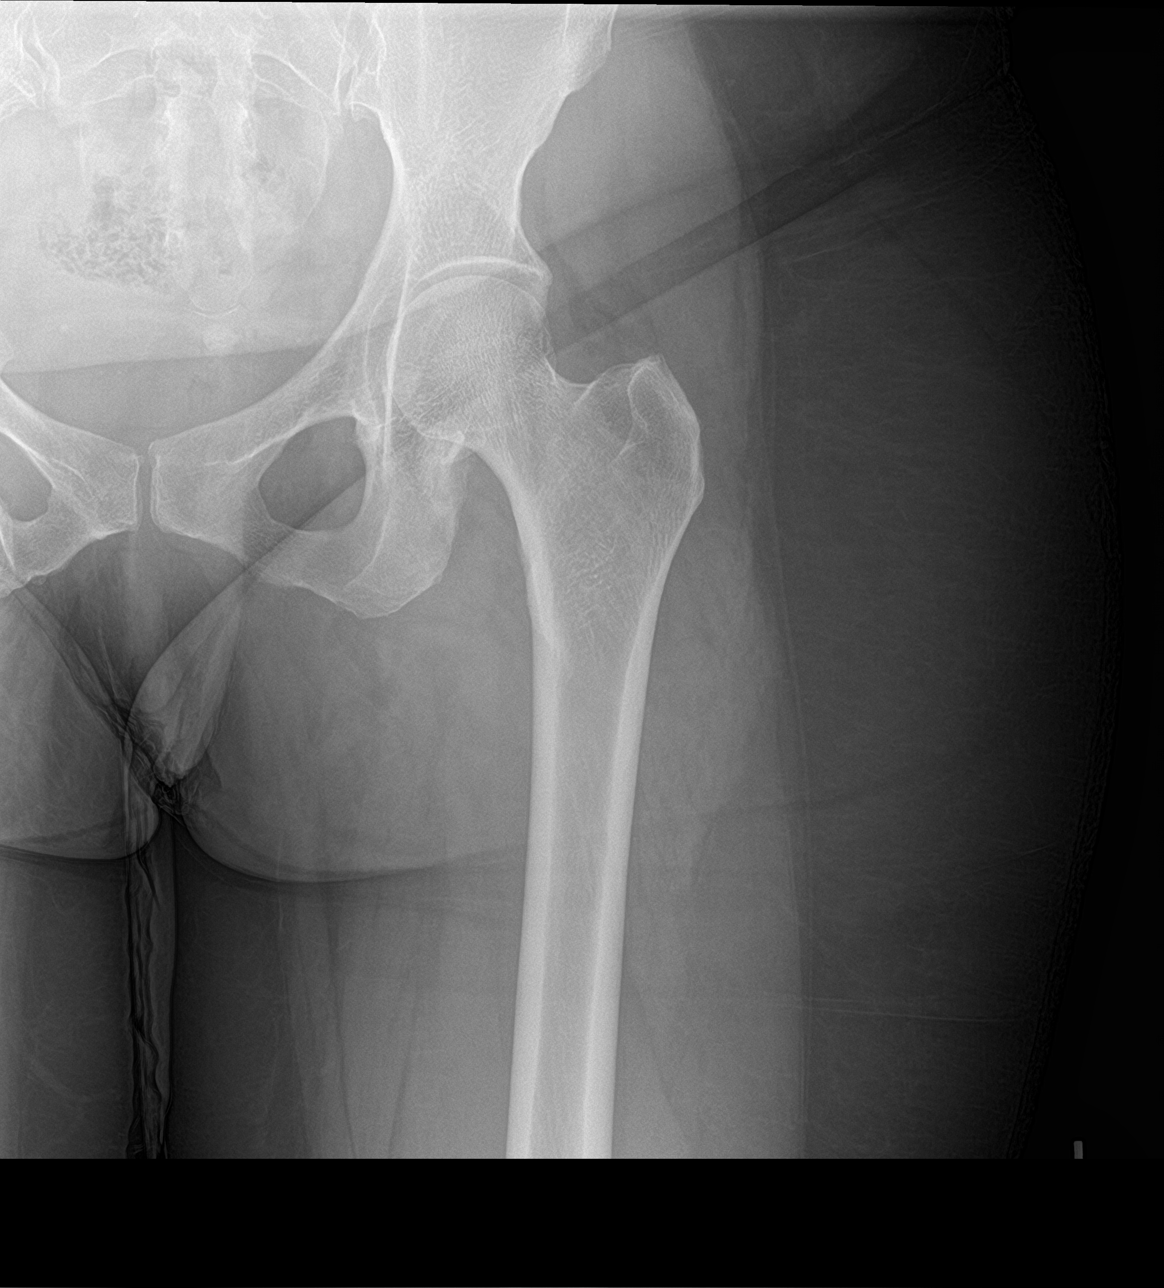

[hip lat]
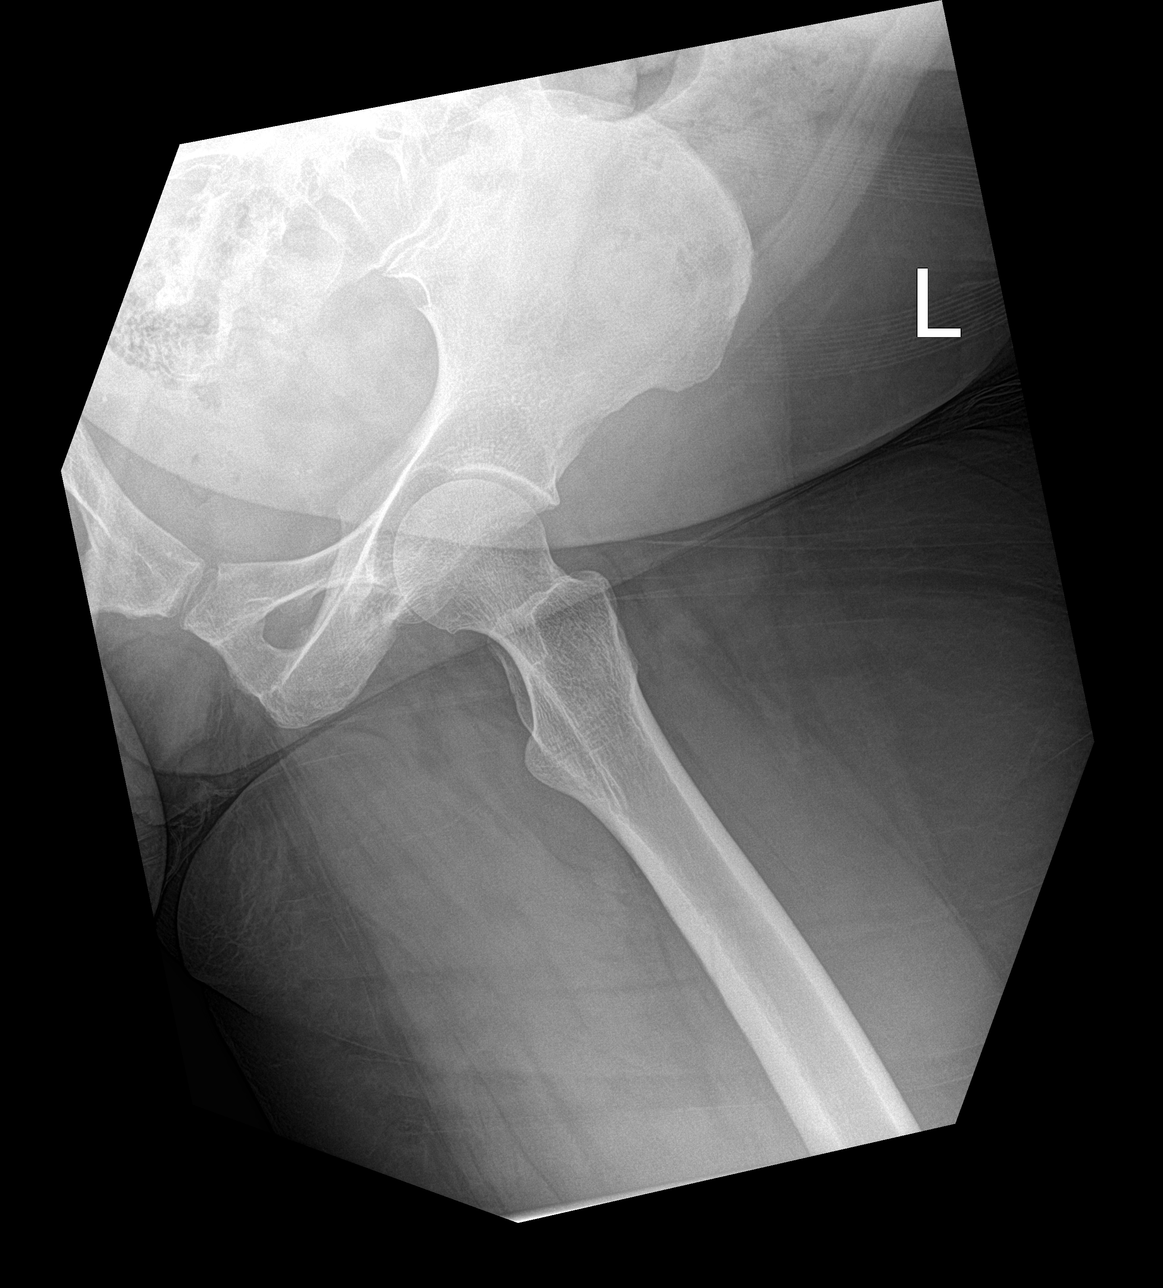

[3 of 3 positions shown; findings below may reference images not displayed]

FINDINGS: Both hips are normally located. No degenerative changes or acute
abnormality.

The pubic symphysis and SI joints are intact. No pelvic fractures.
IMPRESSION: No acute bony findings.
